# Patient Record
Sex: Female | Born: 1975 | Race: White | Hispanic: No | Smoking: Former smoker
Health system: Southern US, Community
[De-identification: ages and names within clinical notes are randomized; demographics above are authoritative.]

## PROBLEM LIST (undated history)

## (undated) DIAGNOSIS — M797 Fibromyalgia: Secondary | ICD-10-CM

## (undated) DIAGNOSIS — F419 Anxiety disorder, unspecified: Secondary | ICD-10-CM

## (undated) DIAGNOSIS — F32A Depression, unspecified: Secondary | ICD-10-CM

## (undated) DIAGNOSIS — K589 Irritable bowel syndrome without diarrhea: Secondary | ICD-10-CM

## (undated) DIAGNOSIS — M549 Dorsalgia, unspecified: Secondary | ICD-10-CM

## (undated) DIAGNOSIS — K219 Gastro-esophageal reflux disease without esophagitis: Secondary | ICD-10-CM

## (undated) DIAGNOSIS — G43909 Migraine, unspecified, not intractable, without status migrainosus: Secondary | ICD-10-CM

## (undated) DIAGNOSIS — T8859XA Other complications of anesthesia, initial encounter: Secondary | ICD-10-CM

## (undated) DIAGNOSIS — T4145XA Adverse effect of unspecified anesthetic, initial encounter: Secondary | ICD-10-CM

## (undated) DIAGNOSIS — M542 Cervicalgia: Secondary | ICD-10-CM

## (undated) DIAGNOSIS — F329 Major depressive disorder, single episode, unspecified: Secondary | ICD-10-CM

## (undated) HISTORY — DX: Anxiety disorder, unspecified: F41.9

## (undated) HISTORY — PX: BACK SURGERY: SHX140

## (undated) HISTORY — DX: Cervicalgia: M54.2

## (undated) HISTORY — PX: OTHER SURGICAL HISTORY: SHX169

## (undated) HISTORY — PX: TYMPANOSTOMY TUBE PLACEMENT: SHX32

## (undated) HISTORY — PX: TONSILLECTOMY: SUR1361

## (undated) HISTORY — DX: Fibromyalgia: M79.7

## (undated) HISTORY — PX: SPINE SURGERY: SHX786

## (undated) HISTORY — PX: INTRAUTERINE DEVICE INSERTION: SHX323

## (undated) HISTORY — DX: Irritable bowel syndrome, unspecified: K58.9

---

## 2000-06-10 ENCOUNTER — Inpatient Hospital Stay (HOSPITAL_COMMUNITY): Admission: AD | Admit: 2000-06-10 | Discharge: 2000-06-10 | Payer: Self-pay

## 2000-06-15 ENCOUNTER — Inpatient Hospital Stay (HOSPITAL_COMMUNITY): Admission: AD | Admit: 2000-06-15 | Discharge: 2000-06-15 | Payer: Self-pay | Admitting: Obstetrics

## 2000-06-15 ENCOUNTER — Encounter: Payer: Self-pay | Admitting: Obstetrics

## 2000-06-18 ENCOUNTER — Encounter (HOSPITAL_COMMUNITY): Admission: RE | Admit: 2000-06-18 | Discharge: 2000-06-30 | Payer: Self-pay | Admitting: Obstetrics

## 2000-06-27 ENCOUNTER — Inpatient Hospital Stay (HOSPITAL_COMMUNITY): Admission: AD | Admit: 2000-06-27 | Discharge: 2000-06-27 | Payer: Self-pay | Admitting: Obstetrics

## 2000-06-28 ENCOUNTER — Inpatient Hospital Stay (HOSPITAL_COMMUNITY): Admission: AD | Admit: 2000-06-28 | Discharge: 2000-06-28 | Payer: Self-pay | Admitting: Obstetrics

## 2000-06-28 ENCOUNTER — Encounter: Payer: Self-pay | Admitting: Obstetrics

## 2000-06-29 ENCOUNTER — Inpatient Hospital Stay (HOSPITAL_COMMUNITY): Admission: AD | Admit: 2000-06-29 | Discharge: 2000-07-01 | Payer: Self-pay | Admitting: Obstetrics

## 2001-09-20 ENCOUNTER — Other Ambulatory Visit: Admission: RE | Admit: 2001-09-20 | Discharge: 2001-09-20 | Payer: Self-pay | Admitting: Internal Medicine

## 2003-08-12 ENCOUNTER — Emergency Department (HOSPITAL_COMMUNITY): Admission: AD | Admit: 2003-08-12 | Discharge: 2003-08-12 | Payer: Self-pay | Admitting: Family Medicine

## 2007-06-01 ENCOUNTER — Ambulatory Visit (HOSPITAL_COMMUNITY): Admission: RE | Admit: 2007-06-01 | Discharge: 2007-06-01 | Payer: Self-pay | Admitting: *Deleted

## 2007-06-08 ENCOUNTER — Ambulatory Visit (HOSPITAL_COMMUNITY): Admission: RE | Admit: 2007-06-08 | Discharge: 2007-06-08 | Payer: Self-pay | Admitting: *Deleted

## 2007-06-17 ENCOUNTER — Encounter: Admission: RE | Admit: 2007-06-17 | Discharge: 2007-09-07 | Payer: Self-pay | Admitting: *Deleted

## 2007-08-10 ENCOUNTER — Ambulatory Visit (HOSPITAL_COMMUNITY): Admission: RE | Admit: 2007-08-10 | Discharge: 2007-08-10 | Payer: Self-pay | Admitting: *Deleted

## 2007-12-20 ENCOUNTER — Encounter: Admission: RE | Admit: 2007-12-20 | Discharge: 2008-03-19 | Payer: Self-pay | Admitting: *Deleted

## 2007-12-30 HISTORY — PX: LAPAROSCOPIC GASTRIC BANDING: SHX1100

## 2008-01-04 ENCOUNTER — Ambulatory Visit (HOSPITAL_COMMUNITY): Admission: RE | Admit: 2008-01-04 | Discharge: 2008-01-05 | Payer: Self-pay | Admitting: *Deleted

## 2008-04-06 ENCOUNTER — Encounter: Admission: RE | Admit: 2008-04-06 | Discharge: 2008-04-06 | Payer: Self-pay | Admitting: *Deleted

## 2008-07-05 ENCOUNTER — Encounter: Admission: RE | Admit: 2008-07-05 | Discharge: 2008-07-05 | Payer: Self-pay | Admitting: *Deleted

## 2008-11-09 ENCOUNTER — Emergency Department (HOSPITAL_COMMUNITY): Admission: EM | Admit: 2008-11-09 | Discharge: 2008-11-09 | Payer: Self-pay | Admitting: Emergency Medicine

## 2009-03-02 ENCOUNTER — Encounter (INDEPENDENT_AMBULATORY_CARE_PROVIDER_SITE_OTHER): Payer: Self-pay | Admitting: Orthopedic Surgery

## 2009-03-02 ENCOUNTER — Ambulatory Visit (HOSPITAL_COMMUNITY): Admission: AD | Admit: 2009-03-02 | Discharge: 2009-03-04 | Payer: Self-pay | Admitting: Orthopedic Surgery

## 2009-03-23 ENCOUNTER — Ambulatory Visit (HOSPITAL_COMMUNITY): Admission: RE | Admit: 2009-03-23 | Discharge: 2009-03-23 | Payer: Self-pay | Admitting: General Surgery

## 2009-09-06 ENCOUNTER — Ambulatory Visit (HOSPITAL_COMMUNITY): Admission: RE | Admit: 2009-09-06 | Discharge: 2009-09-08 | Payer: Self-pay | Admitting: Orthopedic Surgery

## 2009-09-06 ENCOUNTER — Encounter (INDEPENDENT_AMBULATORY_CARE_PROVIDER_SITE_OTHER): Payer: Self-pay | Admitting: Orthopedic Surgery

## 2010-02-04 ENCOUNTER — Ambulatory Visit: Payer: Self-pay | Admitting: Surgery

## 2010-02-27 ENCOUNTER — Ambulatory Visit: Payer: Self-pay | Admitting: Vascular Surgery

## 2010-02-27 ENCOUNTER — Inpatient Hospital Stay (HOSPITAL_COMMUNITY): Admission: RE | Admit: 2010-02-27 | Discharge: 2010-03-02 | Payer: Self-pay | Admitting: Orthopedic Surgery

## 2010-02-28 ENCOUNTER — Encounter (INDEPENDENT_AMBULATORY_CARE_PROVIDER_SITE_OTHER): Payer: Self-pay | Admitting: Orthopedic Surgery

## 2010-02-28 ENCOUNTER — Ambulatory Visit: Payer: Self-pay | Admitting: Vascular Surgery

## 2010-12-16 LAB — URINALYSIS, ROUTINE W REFLEX MICROSCOPIC
Glucose, UA: NEGATIVE mg/dL
Hgb urine dipstick: NEGATIVE
Ketones, ur: NEGATIVE mg/dL
Protein, ur: NEGATIVE mg/dL
Specific Gravity, Urine: 1.011 (ref 1.005–1.030)

## 2010-12-16 LAB — PREGNANCY, URINE: Preg Test, Ur: NEGATIVE

## 2010-12-16 LAB — CBC
HCT: 40.8 % (ref 36.0–46.0)
MCV: 91.4 fL (ref 78.0–100.0)
Platelets: 275 10*3/uL (ref 150–400)
RBC: 4.47 MIL/uL (ref 3.87–5.11)
WBC: 5.8 10*3/uL (ref 4.0–10.5)

## 2010-12-16 LAB — URINE MICROSCOPIC-ADD ON

## 2011-01-06 LAB — HEMOGLOBIN AND HEMATOCRIT, BLOOD: Hemoglobin: 14 g/dL (ref 12.0–15.0)

## 2011-01-06 LAB — PREGNANCY, URINE: Preg Test, Ur: NEGATIVE

## 2011-02-11 NOTE — Op Note (Signed)
NAME:  CARRY, WEESNER             ACCOUNT NO.:  [id number]   MEDICAL RECORD NO.:  [id number]          PATIENT TYPE:  AMB   LOCATION:  DAY                          FACILITY:  [facility]   PHYSICIAN:  Thornton Park. Daphine Deutscher, MD  DATE OF BIRTH:  11/24/1975   DATE OF PROCEDURE:  01/04/2008  DATE OF DISCHARGE:                               OPERATIVE REPORT   PREOPERATIVE DIAGNOSIS:  Morbid obesity and small hiatal hernia.  BMI of  40.   POSTOPERATIVE DIAGNOSES:  Morbid obesity and small hiatal hernia.  BMI  of 40.   PROCEDURE:  Lap band APS system by Allergan with two suture closure of  the hiatus.   SURGEON:  Thornton Park. Daphine Deutscher, MD   ASSISTANT:  Sandria Bales. Ezzard Standing, M.D.   ANESTHESIA:  General endotracheal.   DESCRIPTION OF PROCEDURE:  Naika Noto was taken to room one, given  general anesthesia.  The abdomen was prepped with Techni-Care and draped  sterilely.  I entered the abdomen through the left upper quadrant using  a 0 degree OptiVu 10 mm without difficulty.  Once insufflated two ports  were placed on the right including a 15 in the upper position and a  regular 12 on the right and then another one to the left of the  umbilicus for the camera and a 5 in the upper midline for the liver  retractor using the Bagley.  First dissection was done along the  gastrohepatic window and looked at the hiatus.  I also passed an APS  balloon, pulled it back and it did not go above the EG junction.  However, in looking at the crura there was a gap that was very prominent  and I could see how this sliding hernia worked.  I went ahead and  repaired it with two free sutures using the tie knots. This approximated  the crura posteriorly nicely.   I then slid down to the pars flaccida approach and passed the band  passer and then brought the APS band around the stomach and snapped it  in place.  It was then plicated anteriorly with three sutures again  using the tie knot and Surgitek material.  It  was brought out through  the lower port on the right and a pocket was created for the port.  Port  was connected to the tubing and it was placed in the abdomen and then  tied down to the fascia.  The wounds were irrigated, closed with 4-0  Vicryl and with Dermabond.  The patient seemed to tolerate the procedure  well and was taken to recovery room in satisfactory condition.      Thornton Park Daphine Deutscher, MD  Electronically Signed     MBM/MEDQ  D:  01/04/2008  T:  01/04/2008  Job:  [id number]   cc:   Dr. Brandt Loosen

## 2011-02-11 NOTE — Op Note (Signed)
NAME:  Kimberly Simpson, Kimberly Simpson             ACCOUNT NO.:  [id number]   MEDICAL RECORD NO.:  [id number]          PATIENT TYPE:  OIB   LOCATION:  1509                         FACILITY:  [facility]   PHYSICIAN:  Marlowe Kays, M.D.  DATE OF BIRTH:  Jan 25, 1976   DATE OF PROCEDURE:  03/02/2009  DATE OF DISCHARGE:                               OPERATIVE REPORT   PREOPERATIVE DIAGNOSIS:  Herniated nucleus pulposus, L5-S1, left.   POSTOPERATIVE DIAGNOSIS:  Herniated nucleus pulposus, L5-S1, left.   OPERATION:  Microdiskectomy, L5-S1 left, with excision of multiple free  fragments.   SURGEON:  Marlowe Kays, MD.   ASSISTANT:  Georges Lynch. Gioffre, MD.   ANESTHESIA:  General.   INDICATION FOR PROCEDURE:  Severe left leg pain with numbness in her  foot, and a large disk herniation central and to the left at L5-S1.   PROCEDURE:  Prophylactic antibiotics, satisfactory general anesthesia,  knee-chest position on the Andrews frame, back was prepped with  DuraPrep, and with 3 spinal needles and a lateral x-ray we tentatively  localized the L5-S1 interspace and then continued draping the back in a  sterile field.  Based on the initial x-ray, I made a vertical midline  incision and located what we thought was the spinous process of L5,  which we tacked the Kocher clamp and a bony prominence distal to this.  A second lateral x-ray demonstrated that the superior Kocher was on the  spinous processes of L5, and we were able to identify the sacrum clearly  by anatomy on further dissection.  Soft tissue was dissected off of the  lamina to either side, and a self-retaining McCullough retractor placed.  I  undermined the superior portion of the segment with a small curette.  First with 2 mm and then with 3 mm Kerrison rongeurs, I began removing  bone and ligamentum flavum.  We then brought in the microscope and  completed the removal of ligamentum flavum and lateral bone.  The S1  nerve root was identified.  It  was quite swollen and inflamed.  We are  able to mobilize it and beneath it was a large protruding amount of disk  fragment, which actually came forth under pressure and was a large  expelled disk fragment about the size of my thumb nail.  I then removed  numerous other large pieces of disk material and then entered the  interspace removing additional disk material until all disk material  obtainable was removed.  The L5 nerve root was noted and protected  during the course of this.  At the conclusion of removing all the disk  material we could obtain, I used a hockey-stick and found that the L5  nerve root foramen and that for the S1 were both widely patent and the  dura and S1 nerve root were freely movable.  There was no unusual  bleeding during the case.  The wound was irrigated with sterile saline.  Gelfoam soaked in thrombin was placed over the interspace and around the  S1 nerve root and dura.  The self-retaining retractors were carefully  removed and the wound closed in  layers with interrupted #1 Vicryl in the  fascia and deep subcutaneous tissue, 2-0 Vicryl  superficial in the subcutaneous tissue, and staples in the skin.  Betadine and Adaptic dry sterile dressing were applied.  She tolerated  the procedure well, of course was cautiously placed in a PACU bed, and  taken there without any known complication.           ______________________________  Marlowe Kays, M.D.     JA/MEDQ  D:  03/02/2009  T:  03/03/2009  Job:  [id number]

## 2011-02-11 NOTE — Assessment & Plan Note (Signed)
OFFICE VISIT   Kimberly Simpson, Kimberly Simpson  DOB:  09/30/1975                                       02/04/2010  ZOXWR#:[id number]   REASON FOR VISIT:  Back pain.   HISTORY:  This is a 35 year old female seen at rest of Dr. Shon Baton to  evaluate for the anterior lumbar exposure.  The patient has had prior  posterior approaches to her back pain without benefit, and she is now  scheduled for anterior lumbar surgery.  Patient has a history of having  a lap band placed.  She still has report, which is readily palpable in  the right side of her abdomen.  She does not have any history of DVT.  She does not have symptoms of arterial insufficiency in her lower  extremities.   The patient suffers from headaches for which she takes Topamax for.  She  has approximately 2 to 3 migraines a month.  She also takes medication  for depression.   PAST MEDICAL HISTORY:  Past medical history is positive for migraines,  depression and back pain, obesity, status post lap band repair.   PAST SURGICAL HISTORY:  Lap band surgery, tonsillectomy, left knee and  back surgery.   FAMILY HISTORY:  Negative for cardiovascular at an early age.   SOCIAL HISTORY:  She is widowed with 2 children.  She works in  Administrator, [employer].  Quit smoking in 1998.  Does not drink.   REVIEW OF SYSTEMS:  Positive for weight loss.  CARDIAC:  Negative.  PULMONARY:  Negative.  GI:  Positive constipation.  GU:  Negative.  VASCULAR:  Positive pain in legs when walking and lying flat.  NEURO:  Positive for headaches.  MUSCULOSKELETAL:  Negative.  PSYCH:  Positive for depression.  EENT:  Negative.  HEME:  Negative.  SKIN:  Negative.   ALLERGIES:  Allergies are to Cipro.   MEDICATIONS:  Medications include hydrocodone, Cymbalta, Topamax,  ibuprofen, and multivitamin.   PHYSICAL EXAMINATION:  Heart rate 84, blood pressure 108/71, respiratory  rate 20.  GENERAL:  She is well-appearing, in no distress.  HEENT:   Within normal limits.  LUNGS:  Clear bilaterally.  No wheezing or rhonchi.  CARDIOVASCULAR:  Regular rate and rhythm.  No murmur.  She has palpable  pedal pulses.  ABDOMEN:  Obese but soft.  She has healed port sites.  Her lap band port  is felt palpable the right midabdomen.  No hernias are appreciated.  SKIN:  Without rash.  NEURO:  She is no focal deficits.   ASSESSMENT/PLAN:  Back pain, evaluation for anterior lumbar approach.   PLAN:  I discussed the risks of proceeding include risk of artery and  vein injury and their ramifications.  I think the biggest concern is  making sure we do not disrupt her lap band.  Based on its placement, I  do not think it is going to be an issue, but certainly we discussed that  it could potentially become exposed.  I think that based on where the  incision will be for her exposure, there is a low risk.  We will  coordinate with Dr. Shon Baton to get this on the on the schedule.     Jorge Ny, MD  Electronically Signed   VWB/MEDQ  D:  02/04/2010  T:  02/05/2010  Job:  [id number]

## 2011-03-03 ENCOUNTER — Encounter (INDEPENDENT_AMBULATORY_CARE_PROVIDER_SITE_OTHER): Payer: Self-pay | Admitting: Surgery

## 2011-05-09 ENCOUNTER — Ambulatory Visit (INDEPENDENT_AMBULATORY_CARE_PROVIDER_SITE_OTHER): Payer: BC Managed Care – PPO | Admitting: Physician Assistant

## 2011-05-09 ENCOUNTER — Encounter (INDEPENDENT_AMBULATORY_CARE_PROVIDER_SITE_OTHER): Payer: Self-pay

## 2011-05-09 VITALS — BP 106/78

## 2011-05-09 DIAGNOSIS — Z4651 Encounter for fitting and adjustment of gastric lap band: Secondary | ICD-10-CM

## 2011-05-09 NOTE — Progress Notes (Signed)
  HISTORY: Kimberly Simpson is a 35 y.o.female who received an AP-Standard lap-band in April 2009 by Dr. Daphine Deutscher. She comes in having been seen 3 months ago and has gained 5 lbs however her clothes have not changed in terms of fit as she has been going to the gym for 2 hours a day, 5 days a week. She denies vomiting or regurgitation on a consistent basis but says her hunger has increased as have her portion sizes. She would like an adjustment today.  VITAL SIGNS: Filed Vitals:   05/09/11 0904  BP: 106/78    PHYSICAL EXAM: Physical exam reveals a very well-appearing 35 y.o.female in no apparent distress Neurologic: Awake, alert, oriented Psych: Bright affect, conversant Respiratory: Breathing even and unlabored. No stridor or wheezing Abdomen: Soft, nontender, nondistended to palpation. Incisions well-healed. No incisional hernias. Port easily palpated. Extremities: Atraumatic, good range of motion.  ASSESMENT: 35 y.o.  female  s/p AP-Standard lap-band.   PLAN: The patient's port was accessed with a 20G Huber needle without difficulty. Clear fluid was aspirated and 0.25 mL saline was added to the port to give a total volume of 5.75 mL. The patient was able to swallow water without difficulty following the procedure and was instructed to take clear liquids for the next 24-48 hours and advance slowly as tolerated.

## 2011-05-09 NOTE — Patient Instructions (Signed)
Take clear liquids for the next 48 hours. Thin protein shakes are ok to start on Saturday evening. Call us if you have persistent vomiting or regurgitation, night cough or reflux symptoms. Return as scheduled or sooner if you notice no changes in hunger/portion sizes.   

## 2011-06-23 LAB — HEMOGLOBIN AND HEMATOCRIT, BLOOD: Hemoglobin: 15.3 — ABNORMAL HIGH

## 2011-06-24 LAB — CBC
HCT: 38.1
MCHC: 35.6
MCV: 88
Platelets: 249

## 2011-06-24 LAB — DIFFERENTIAL
Basophils Relative: 0
Eosinophils Absolute: 0.1
Eosinophils Relative: 1
Neutrophils Relative %: 55

## 2011-07-28 ENCOUNTER — Encounter (INDEPENDENT_AMBULATORY_CARE_PROVIDER_SITE_OTHER): Payer: Self-pay

## 2011-09-26 ENCOUNTER — Ambulatory Visit (INDEPENDENT_AMBULATORY_CARE_PROVIDER_SITE_OTHER): Payer: BC Managed Care – PPO | Admitting: Physician Assistant

## 2011-09-26 ENCOUNTER — Encounter (INDEPENDENT_AMBULATORY_CARE_PROVIDER_SITE_OTHER): Payer: Self-pay

## 2011-09-26 VITALS — BP 102/70 | HR 79 | Temp 98.2°F | Ht 64.0 in | Wt 191.4 lb

## 2011-09-26 DIAGNOSIS — E669 Obesity, unspecified: Secondary | ICD-10-CM

## 2011-09-26 DIAGNOSIS — Z4651 Encounter for fitting and adjustment of gastric lap band: Secondary | ICD-10-CM

## 2011-09-26 NOTE — Patient Instructions (Signed)
Take clear liquids for the next 48 hours. Thin protein shakes are ok to start on Saturday evening. Call us if you have persistent vomiting or regurgitation, night cough or reflux symptoms. Return as scheduled or sooner if you notice no changes in hunger/portion sizes.   

## 2011-09-26 NOTE — Progress Notes (Signed)
  HISTORY: Kimberly Simpson is a 35 y.o.female who received an AP-Standard lap-band in April 2009 by Dr. Daphine Deutscher. She comes in with complaints of increased hunger and portion sizes. No vomiting or regurgitation. She'd like an adjustment as her weight is continuing to increase.  VITAL SIGNS: Filed Vitals:   09/26/11 1029  BP: 102/70  Pulse: 79  Temp: 98.2 F (36.8 C)    PHYSICAL EXAM: Physical exam reveals a very well-appearing 35 y.o.female in no apparent distress Neurologic: Awake, alert, oriented Psych: Bright affect, conversant Respiratory: Breathing even and unlabored. No stridor or wheezing Abdomen: Soft, nontender, nondistended to palpation. Incisions well-healed. No incisional hernias. Port easily palpated. Extremities: Atraumatic, good range of motion.  ASSESMENT: 35 y.o.  female  s/p AP-Standard lap-band.   PLAN: The patient's port was accessed with a 20G Huber needle without difficulty. Clear fluid was aspirated and 0.25 mL saline was added to the port to give a total predicted volume of 6 mL. The patient was able to swallow water without difficulty following the procedure and was instructed to take clear liquids for the next 24-48 hours and advance slowly as tolerated.

## 2012-02-02 ENCOUNTER — Encounter (INDEPENDENT_AMBULATORY_CARE_PROVIDER_SITE_OTHER): Payer: Self-pay | Admitting: General Surgery

## 2012-06-01 ENCOUNTER — Other Ambulatory Visit: Payer: Self-pay | Admitting: Family Medicine

## 2012-06-01 ENCOUNTER — Other Ambulatory Visit (HOSPITAL_COMMUNITY)
Admission: RE | Admit: 2012-06-01 | Discharge: 2012-06-01 | Disposition: A | Discharge status: Home / Self Care | Payer: BC Managed Care – PPO | Source: Ambulatory Visit | Attending: Family Medicine | Admitting: Family Medicine

## 2012-06-01 DIAGNOSIS — Z124 Encounter for screening for malignant neoplasm of cervix: Secondary | ICD-10-CM | POA: Insufficient documentation

## 2012-06-01 DIAGNOSIS — Z113 Encounter for screening for infections with a predominantly sexual mode of transmission: Secondary | ICD-10-CM | POA: Insufficient documentation

## 2012-06-01 DIAGNOSIS — R87810 Cervical high risk human papillomavirus (HPV) DNA test positive: Secondary | ICD-10-CM | POA: Insufficient documentation

## 2012-07-02 ENCOUNTER — Other Ambulatory Visit: Payer: Self-pay | Admitting: Family Medicine

## 2012-11-01 ENCOUNTER — Telehealth (INDEPENDENT_AMBULATORY_CARE_PROVIDER_SITE_OTHER): Payer: Self-pay

## 2012-11-01 NOTE — Telephone Encounter (Signed)
Pt aware of appt 11/02/12 @ 4p

## 2012-11-02 ENCOUNTER — Encounter (INDEPENDENT_AMBULATORY_CARE_PROVIDER_SITE_OTHER): Payer: Self-pay | Admitting: Surgery

## 2012-11-02 ENCOUNTER — Ambulatory Visit (INDEPENDENT_AMBULATORY_CARE_PROVIDER_SITE_OTHER): Payer: BC Managed Care – PPO | Admitting: Surgery

## 2012-11-02 VITALS — BP 112/66 | HR 76 | Temp 97.8°F | Resp 16 | Ht 65.0 in | Wt 178.6 lb

## 2012-11-02 DIAGNOSIS — Z9884 Bariatric surgery status: Secondary | ICD-10-CM | POA: Insufficient documentation

## 2012-11-02 NOTE — Progress Notes (Signed)
[facility]  Ovidio Kin, MD,  FACS [address] [place]  [address] [place], [place]    [address] Phone:  [phone] FAX:  [phone]   Re:   SHELBA SUSI DOB:   09-15-1976 MRN:   [id number]  ASSESSMENT AND PLAN: 1.  History of Lap Band, APS, and HH repair - M. Daphine Deutscher - 01/04/2008  Lap band too tight, reason not clear.  2.0 cc fluid removed today.  To see Dr. Daphine Deutscher or Mardelle Matte back in 8 weeks.  If no better in next few days, will call office back and will be seen earlier.  Will obtain KUB for lap band placement.  2.  History of back surgery - June 2011.  Sheela Stack   HISTORY OF PRESENT ILLNESS: Chief Complaint  Patient presents with  . Other    vomiting and reflus / ?lap band slip / pt of MM    MARVETTE SCHAMP is a 37 y.o. (DOB: October 20, 1975)  white female who is a patient of RANKINS,VICTORIA, MD and comes to me today for evaluation of lap band.  Ms. Buschman was last seen by Mardelle Matte 09/26/2011 - so it has been > one year.  Last x-ray that included lap band was 02/28/2010 during her back surgery - the band location looked okay at that time.  The patient has been happy with her lap band and her weight loss. Over Christmas 2013, she developed bronchitis and received 2 courses of antibiotics and steroids. She noticed an increase in reflux during this time. Then over the [employer] week end, she had increasing trouble with regurgitation. She developed intolerance to solid food.  She thought that half the food she ate she spit back up. After spitting up, she is still hungry, but cannot tolerate more than liquids right now.  She's had good weight loss. She is 13 pounds lighter than when she was last seen in December of 2012.  After removing the 2 cc of fluid from her lap band, she felt better, but still has some symptoms. She knows to call our office if she is not symptom-free in 4-7 days.  PHYSICAL EXAM: BP 112/66  Pulse 76  Temp 97.8 F (36.6 C)  (Temporal)  Resp 16  Ht 5\' 5"  (1.651 m)  Wt 178 lb 9.6 oz (81.012 kg)  BMI 29.72 kg/m2  Abdomen - soft, BS present.  Port in good location.  Procedure:  The lap band was accessed.  It was under about 0.5 cc of pressure.  I removed 2.0 cc, leaving about 4.0 cc in port.  DATA REVIEWED: Old chart.  Ovidio Kin, MD, FACS Office:  [phone]

## 2012-11-03 ENCOUNTER — Telehealth (INDEPENDENT_AMBULATORY_CARE_PROVIDER_SITE_OTHER): Payer: Self-pay

## 2012-11-03 ENCOUNTER — Encounter (INDEPENDENT_AMBULATORY_CARE_PROVIDER_SITE_OTHER): Payer: BC Managed Care – PPO | Admitting: Surgery

## 2012-11-03 ENCOUNTER — Ambulatory Visit (HOSPITAL_COMMUNITY)
Admission: RE | Admit: 2012-11-03 | Discharge: 2012-11-03 | Disposition: A | Discharge status: Home / Self Care | Payer: BC Managed Care – PPO | Source: Ambulatory Visit | Attending: Surgery | Admitting: Surgery

## 2012-11-03 ENCOUNTER — Other Ambulatory Visit (INDEPENDENT_AMBULATORY_CARE_PROVIDER_SITE_OTHER): Payer: Self-pay

## 2012-11-03 DIAGNOSIS — R112 Nausea with vomiting, unspecified: Secondary | ICD-10-CM | POA: Insufficient documentation

## 2012-11-03 DIAGNOSIS — Z9884 Bariatric surgery status: Secondary | ICD-10-CM | POA: Insufficient documentation

## 2012-11-03 NOTE — Telephone Encounter (Signed)
Called pt to let her know that I scheduled her appt with MM for Thursday, Feb 13th @ 310p.  Pt stated that she was hoping for an appt after 4p, so I r/s for 415p.

## 2012-11-03 NOTE — Telephone Encounter (Addendum)
CORRECTION; appt time is set for 430p - not 415p

## 2012-11-04 ENCOUNTER — Telehealth (INDEPENDENT_AMBULATORY_CARE_PROVIDER_SITE_OTHER): Payer: Self-pay

## 2012-11-04 NOTE — Telephone Encounter (Signed)
Follow up call . Patient states her stomach is still hurting but she will be ok until she see Dr. Daphine Deutscher 11/11/12 @ 4:30. I informed her that her Lap band is in place per Dr. Ezzard Standing. Advised her  To call if  Her  condition changed.

## 2012-11-11 ENCOUNTER — Encounter (INDEPENDENT_AMBULATORY_CARE_PROVIDER_SITE_OTHER): Payer: BC Managed Care – PPO | Admitting: Surgery

## 2012-11-18 ENCOUNTER — Encounter (INDEPENDENT_AMBULATORY_CARE_PROVIDER_SITE_OTHER): Payer: BC Managed Care – PPO | Admitting: Surgery

## 2012-11-22 ENCOUNTER — Telehealth (INDEPENDENT_AMBULATORY_CARE_PROVIDER_SITE_OTHER): Payer: Self-pay

## 2012-11-22 NOTE — Telephone Encounter (Signed)
Spoke with pt, giving her her new appt date and time: Monday, March 3 @ 450p.

## 2012-11-26 ENCOUNTER — Telehealth (INDEPENDENT_AMBULATORY_CARE_PROVIDER_SITE_OTHER): Payer: Self-pay

## 2012-11-26 NOTE — Telephone Encounter (Signed)
Called pt to see if she can r/s her appt time on 3/3 from 450 to 12noon.  Pt stated that she can only come early mornings or late afternoons.  We r/s's for 3/3 @ 830a.

## 2012-11-29 ENCOUNTER — Ambulatory Visit (INDEPENDENT_AMBULATORY_CARE_PROVIDER_SITE_OTHER): Payer: BC Managed Care – PPO | Admitting: Surgery

## 2012-11-29 ENCOUNTER — Encounter (INDEPENDENT_AMBULATORY_CARE_PROVIDER_SITE_OTHER): Payer: BC Managed Care – PPO | Admitting: Surgery

## 2012-11-29 VITALS — BP 104/62 | HR 76 | Temp 97.2°F | Resp 16 | Ht 64.0 in | Wt 187.8 lb

## 2012-11-29 DIAGNOSIS — Z9884 Bariatric surgery status: Secondary | ICD-10-CM

## 2012-11-29 NOTE — Patient Instructions (Signed)

## 2012-11-29 NOTE — Progress Notes (Signed)
Lapband Fill Encounter Problem List:   Patient Active Problem List  Diagnosis  . History of laparoscopic adjustable gastric banding, APS, 01/04/2008    [name] Body mass index is 32.22 kg/(m^2). Weight loss since surgery  46.2    Having regurgitation?:  Not anymore  Feel that they need a fill?  yes  Nocturnal reflux?  no  Amount of fill  1 cc   Port site: Easily accessed  Instructions given and weight loss goals discussed.  Xray showed lapband position to be good.  Will followup with Mardelle Matte or me in 3 months  Matt B. Daphine Deutscher, MD, FACS

## 2012-12-16 ENCOUNTER — Encounter (INDEPENDENT_AMBULATORY_CARE_PROVIDER_SITE_OTHER): Payer: BC Managed Care – PPO

## 2012-12-23 ENCOUNTER — Ambulatory Visit (INDEPENDENT_AMBULATORY_CARE_PROVIDER_SITE_OTHER): Payer: BC Managed Care – PPO | Admitting: Physician Assistant

## 2012-12-23 ENCOUNTER — Encounter (INDEPENDENT_AMBULATORY_CARE_PROVIDER_SITE_OTHER): Payer: Self-pay

## 2012-12-23 VITALS — BP 102/68 | HR 68 | Temp 98.2°F | Resp 16 | Ht 64.0 in | Wt 189.6 lb

## 2012-12-23 DIAGNOSIS — Z4651 Encounter for fitting and adjustment of gastric lap band: Secondary | ICD-10-CM

## 2012-12-23 NOTE — Progress Notes (Signed)
  HISTORY: Kimberly Simpson is a 37 y.o.female who received an AP-Standard lap-band in April 2009 by Dr. Daphine Deutscher. She comes in with continued hunger and large portion sizes. She had fluid removed in February by Dr. Ezzard Standing for obstructive symptoms. She had 1 mL replaced by Dr. Daphine Deutscher earlier this month. Her total volume was 6 mL for a year before having any issues.  VITAL SIGNS: Filed Vitals:   12/23/12 1118  BP: 102/68  Pulse: 68  Temp: 98.2 F (36.8 C)  Resp: 16    PHYSICAL EXAM: Physical exam reveals a very well-appearing 37 y.o.female in no apparent distress Neurologic: Awake, alert, oriented Psych: Bright affect, conversant Respiratory: Breathing even and unlabored. No stridor or wheezing Abdomen: Soft, nontender, nondistended to palpation. Incisions well-healed. No incisional hernias. Port easily palpated. Extremities: Atraumatic, good range of motion.  ASSESMENT: 37 y.o.  female  s/p AP-Standard lap-band.   PLAN: The patient's port was accessed with a 20G Huber needle without difficulty. Clear fluid was aspirated and 1 mL saline was added to the port to give a total predicted volume of 6 mL. The patient was able to swallow water without difficulty following the procedure and was instructed to take clear liquids for the next 24-48 hours and advance slowly as tolerated.

## 2012-12-23 NOTE — Patient Instructions (Signed)
Take clear liquids tonight. Thin protein shakes are ok to start tomorrow morning. Slowly advance your diet thereafter. Call us if you have persistent vomiting or regurgitation, night cough or reflux symptoms. Return as scheduled or sooner if you notice no changes in hunger/portion sizes.  

## 2013-01-03 ENCOUNTER — Other Ambulatory Visit: Payer: Self-pay | Admitting: Family Medicine

## 2013-01-03 ENCOUNTER — Other Ambulatory Visit (HOSPITAL_COMMUNITY)
Admission: RE | Admit: 2013-01-03 | Discharge: 2013-01-03 | Disposition: A | Discharge status: Home / Self Care | Payer: BC Managed Care – PPO | Source: Ambulatory Visit | Attending: Family Medicine | Admitting: Family Medicine

## 2013-01-03 DIAGNOSIS — R6889 Other general symptoms and signs: Secondary | ICD-10-CM | POA: Insufficient documentation

## 2013-01-03 DIAGNOSIS — Z1151 Encounter for screening for human papillomavirus (HPV): Secondary | ICD-10-CM | POA: Insufficient documentation

## 2013-01-11 ENCOUNTER — Telehealth (INDEPENDENT_AMBULATORY_CARE_PROVIDER_SITE_OTHER): Payer: Self-pay | Admitting: *Deleted

## 2013-01-11 NOTE — Telephone Encounter (Signed)
Patient states she is only able to take in liquids.  Patient feels that her band is too tight.

## 2013-01-12 ENCOUNTER — Ambulatory Visit (INDEPENDENT_AMBULATORY_CARE_PROVIDER_SITE_OTHER): Payer: BC Managed Care – PPO | Admitting: General Surgery

## 2013-01-12 ENCOUNTER — Encounter (INDEPENDENT_AMBULATORY_CARE_PROVIDER_SITE_OTHER): Payer: Self-pay | Admitting: General Surgery

## 2013-01-12 VITALS — BP 118/72 | HR 70 | Resp 18 | Ht 64.0 in | Wt 183.0 lb

## 2013-01-12 DIAGNOSIS — Z4651 Encounter for fitting and adjustment of gastric lap band: Secondary | ICD-10-CM

## 2013-01-12 NOTE — Progress Notes (Signed)
History: Patient has had her lap band since 2009. She has had overall very good weight loss. She became over restrictive in February and had 2 cc removed. She had 1 cc replaced in March and another cc replaced 3 weeks ago. Since her last fill she has had essentially intolerance to all solid foods and some difficulty with liquids and also nighttime regurgitation over the last 4 or 5 days.  Exam: BP 118/72  Pulse 70  Resp 18  Ht 5\' 4"  (1.626 m)  Wt 183 lb (83.008 kg)  BMI 31.4 kg/m2 Total weight loss 51 pounds, 7 pounds since her last visit General: Appears well Abdomen: Soft and nontender. Port site looks fine.  Assessment and plan: Over restricted at 6 cc. She is tolerating fluids OK and we elected to remove one half cc. She felt like water went down better after this. She will call if this does not allow her to be small amounts of solid food and otherwise keep her appointment with Dr. Daphine Deutscher in June.

## 2013-01-12 NOTE — Patient Instructions (Addendum)
Liquids only for the next 2 days. Keep your appointment with Dr. Daphine Deutscher or call us if this does not help

## 2013-03-16 ENCOUNTER — Encounter (INDEPENDENT_AMBULATORY_CARE_PROVIDER_SITE_OTHER): Payer: Self-pay | Admitting: Surgery

## 2013-03-16 ENCOUNTER — Ambulatory Visit (INDEPENDENT_AMBULATORY_CARE_PROVIDER_SITE_OTHER): Payer: BC Managed Care – PPO | Admitting: Surgery

## 2013-03-16 VITALS — BP 108/86 | HR 74 | Temp 96.8°F | Resp 14 | Ht 64.0 in | Wt 184.0 lb

## 2013-03-16 DIAGNOSIS — Z9884 Bariatric surgery status: Secondary | ICD-10-CM

## 2013-03-16 NOTE — Progress Notes (Signed)
Lapband Fill Encounter Problem List:   Patient Active Problem List   Diagnosis Date Noted  . History of laparoscopic adjustable gastric banding, APS, 01/04/2008 11/02/2012    Kimberly Simpson Body mass index is 31.57 kg/(m^2). Weight loss since surgery  50  Having regurgitation?:  yes  Feel that they need a fill?  Removal of fluid  Nocturnal reflux? yes  Amount of fill  -0.5     Instructions given and weight loss goals discussed.    Will see sooner if persistant GER.    Matt B. Daphine Deutscher, MD, FACS

## 2013-03-16 NOTE — Patient Instructions (Signed)
If you are still experiencing food tolerance issues and reflux, see Dr. Daphine Deutscher sooner than the scheduled appointment.

## 2013-05-17 ENCOUNTER — Ambulatory Visit (INDEPENDENT_AMBULATORY_CARE_PROVIDER_SITE_OTHER): Payer: BC Managed Care – PPO | Admitting: Surgery

## 2013-05-17 ENCOUNTER — Encounter (INDEPENDENT_AMBULATORY_CARE_PROVIDER_SITE_OTHER): Payer: Self-pay | Admitting: Surgery

## 2013-05-17 VITALS — BP 110/72 | HR 80 | Resp 16 | Ht 64.0 in | Wt 187.0 lb

## 2013-05-17 DIAGNOSIS — Z9884 Bariatric surgery status: Secondary | ICD-10-CM

## 2013-05-17 DIAGNOSIS — Z4651 Encounter for fitting and adjustment of gastric lap band: Secondary | ICD-10-CM

## 2013-05-17 NOTE — Progress Notes (Signed)
Lapband Fill Encounter Problem List:   Patient Active Problem List   Diagnosis Date Noted  . History of laparoscopic adjustable gastric banding, APS, 01/04/2008 11/02/2012    [name] Body mass index is 32.08 kg/(m^2). Weight loss since surgery  47  Having regurgitation?:  no  Feel that they need a fill?  yes  Nocturnal reflux?  no  Amount of fill  0.25     Instructions given and weight loss goals discussed.    Doing well except able eat more and has gained a little weight since I removed 0.5 cc from her band.  0.25 added back  Matt B. Daphine Deutscher, MD, FACS

## 2013-05-17 NOTE — Patient Instructions (Signed)

## 2013-07-13 ENCOUNTER — Encounter (INDEPENDENT_AMBULATORY_CARE_PROVIDER_SITE_OTHER): Payer: Self-pay

## 2013-07-13 ENCOUNTER — Ambulatory Visit (INDEPENDENT_AMBULATORY_CARE_PROVIDER_SITE_OTHER): Payer: BC Managed Care – PPO | Admitting: Surgery

## 2013-07-13 ENCOUNTER — Other Ambulatory Visit (INDEPENDENT_AMBULATORY_CARE_PROVIDER_SITE_OTHER): Payer: Self-pay

## 2013-07-13 ENCOUNTER — Encounter (INDEPENDENT_AMBULATORY_CARE_PROVIDER_SITE_OTHER): Payer: Self-pay | Admitting: Surgery

## 2013-07-13 VITALS — BP 132/80 | HR 79 | Temp 98.0°F | Resp 18 | Ht 64.0 in | Wt 181.0 lb

## 2013-07-13 DIAGNOSIS — R1011 Right upper quadrant pain: Secondary | ICD-10-CM

## 2013-07-13 NOTE — Patient Instructions (Signed)
Cholelithiasis Cholelithiasis (also called gallstones) is a form of gallbladder disease where gallstones form in your gallbladder. The gallbladder is a non-essential organ that stores bile made in the liver, which helps digest fats. Gallstones begin as small crystals and slowly grow into stones. Gallstone pain occurs when the gallbladder spasms, and a gallstone is blocking the duct. Pain can also occur when a stone passes out of the duct.  Women are more likely to develop gallstones than men. Other factors that increase the risk of gallbladder disease are:  Having multiple pregnancies. Physicians sometimes advise removing diseased gallbladders before future pregnancies.  Obesity.  Diets heavy in fried foods and fat.  Increasing age (older than 60).  Prolonged use of medications containing female hormones.  Diabetes mellitus.  Rapid weight loss.  Family history of gallstones (heredity). SYMPTOMS  Feeling sick to your stomach (nauseous).  Abdominal pain.  Yellowing of the skin (jaundice).  Sudden pain. It may persist from several minutes to several hours.  Worsening pain with deep breathing or when jarred.  Fever.  Tenderness to the touch. In some cases, when gallstones do not move into the bile duct, people have no pain or symptoms. These are called "silent" gallstones. TREATMENT In severe cases, emergency surgery may be required. HOME CARE INSTRUCTIONS   Only take over-the-counter or prescription medicines for pain, discomfort, or fever as directed by your caregiver.  Follow a low-fat diet until seen again. Fat causes the gallbladder to contract, which can result in pain.  Follow up as instructed. Attacks are almost always recurrent and surgery is usually required for permanent treatment. SEEK IMMEDIATE MEDICAL CARE IF:   Your pain increases and is not controlled by medications.  You have an oral temperature above 102 F (38.9 C), not controlled by medication.  You  develop nausea and vomiting. MAKE SURE YOU:   Understand these instructions.  Will watch your condition.  Will get help right away if you are not doing well or get worse. Document Released: 09/11/2005 Document Revised: 12/08/2011 Document Reviewed: 11/14/2010 Exit[employer] Patient Information 2014 [employer].  

## 2013-07-13 NOTE — Progress Notes (Signed)
Kimberly Simpson 37 y.o.  Body mass index is 31.05 kg/(m^2).  Patient Active Problem List   Diagnosis Date Noted  . Lapband APS + [facility] repair April 2009 11/02/2012    Allergies  Allergen Reactions  . Ciprocin-Fluocin-Procin [Fluocinolone Acetonide]     Past Surgical History  Procedure Laterality Date  . Spine surgery    . Tonsillectomy    . Back surgery      disc rupture and fusion  . Intrauterine device insertion    . Laparoscopic gastric banding  12/30/2007   Beverley Fiedler, MD 1. Abdominal pain, right upper quadrant     Has been having right upper quadrant pain radiating into her back. Her weight is down to 181 but she says she is unable to keep. She is avoiding food because food brings on pain that is in her upper abdomen and also the stabbing in her back. Her BMI is 31.  Lower ultrasound the gallbladder and I'll see her back shortly thereafter. If she has gallstones which she is concerned about we would proceed with laparoscopic cholecystectomy. Matt B. Daphine Deutscher, MD, [facility] [phone] beeper [phone]  07/13/2013 5:29 PM

## 2013-07-14 ENCOUNTER — Telehealth (INDEPENDENT_AMBULATORY_CARE_PROVIDER_SITE_OTHER): Payer: Self-pay | Admitting: *Deleted

## 2013-07-14 NOTE — Telephone Encounter (Signed)
LMOM for pt to return my call.  I was calling to inform her of an appt at [facility] for an [place] on 07/19/13 with an arrival time of 7:45 am.  Pt should be NPO after midnight.

## 2013-07-15 ENCOUNTER — Ambulatory Visit
Admission: RE | Admit: 2013-07-15 | Discharge: 2013-07-15 | Disposition: A | Discharge status: Home / Self Care | Payer: BC Managed Care – PPO | Source: Ambulatory Visit | Attending: Surgery | Admitting: Surgery

## 2013-07-15 ENCOUNTER — Encounter (INDEPENDENT_AMBULATORY_CARE_PROVIDER_SITE_OTHER): Payer: BC Managed Care – PPO | Admitting: Surgery

## 2013-07-15 DIAGNOSIS — R1011 Right upper quadrant pain: Secondary | ICD-10-CM

## 2013-07-18 ENCOUNTER — Other Ambulatory Visit (INDEPENDENT_AMBULATORY_CARE_PROVIDER_SITE_OTHER): Payer: Self-pay

## 2013-07-18 DIAGNOSIS — R1011 Right upper quadrant pain: Secondary | ICD-10-CM

## 2013-07-19 ENCOUNTER — Other Ambulatory Visit: Payer: BC Managed Care – PPO

## 2013-07-20 ENCOUNTER — Other Ambulatory Visit: Payer: BC Managed Care – PPO

## 2013-07-21 ENCOUNTER — Ambulatory Visit
Admission: RE | Admit: 2013-07-21 | Discharge: 2013-07-21 | Disposition: A | Discharge status: Home / Self Care | Payer: BC Managed Care – PPO | Source: Ambulatory Visit | Attending: Surgery | Admitting: Surgery

## 2013-07-21 DIAGNOSIS — R1011 Right upper quadrant pain: Secondary | ICD-10-CM

## 2013-07-21 MED ORDER — IOHEXOL 300 MG/ML  SOLN
100.0000 mL | Freq: Once | INTRAMUSCULAR | Status: AC | PRN
  Administered 2013-07-21: 100 mL via INTRAVENOUS

## 2013-08-01 ENCOUNTER — Telehealth (INDEPENDENT_AMBULATORY_CARE_PROVIDER_SITE_OTHER): Payer: Self-pay | Admitting: *Deleted

## 2013-08-01 NOTE — Telephone Encounter (Signed)
Patient called asking about her CT results from 07/21/13.  I went ahead and explained to patient that nothing acute was seen.  Explained that they did see some acid in her stomach to indicate GERD. Encouraged patient to go ahead and get on an OTC ant-acid to try to help.  Patient is going to try Nexium and then give [place] a call on Friday if that is not helping.  Explained to patient that it can take a few days to get the medication in her system.  Patient states understanding and agreeable at this time.

## 2013-12-13 ENCOUNTER — Encounter (HOSPITAL_BASED_OUTPATIENT_CLINIC_OR_DEPARTMENT_OTHER): Payer: Self-pay | Admitting: Emergency Medicine

## 2013-12-13 ENCOUNTER — Emergency Department (HOSPITAL_BASED_OUTPATIENT_CLINIC_OR_DEPARTMENT_OTHER)
Admission: EM | Admit: 2013-12-13 | Discharge: 2013-12-13 | Disposition: A | Discharge status: Home / Self Care | Payer: Worker's Compensation | Attending: Emergency Medicine | Admitting: Emergency Medicine

## 2013-12-13 ENCOUNTER — Emergency Department (HOSPITAL_BASED_OUTPATIENT_CLINIC_OR_DEPARTMENT_OTHER): Payer: Worker's Compensation

## 2013-12-13 DIAGNOSIS — X500XXA Overexertion from strenuous movement or load, initial encounter: Secondary | ICD-10-CM | POA: Insufficient documentation

## 2013-12-13 DIAGNOSIS — Y990 Civilian activity done for income or pay: Secondary | ICD-10-CM | POA: Insufficient documentation

## 2013-12-13 DIAGNOSIS — S93402A Sprain of unspecified ligament of left ankle, initial encounter: Secondary | ICD-10-CM

## 2013-12-13 DIAGNOSIS — S93409A Sprain of unspecified ligament of unspecified ankle, initial encounter: Secondary | ICD-10-CM | POA: Insufficient documentation

## 2013-12-13 DIAGNOSIS — Z87891 Personal history of nicotine dependence: Secondary | ICD-10-CM | POA: Insufficient documentation

## 2013-12-13 DIAGNOSIS — R296 Repeated falls: Secondary | ICD-10-CM | POA: Insufficient documentation

## 2013-12-13 DIAGNOSIS — S40019A Contusion of unspecified shoulder, initial encounter: Secondary | ICD-10-CM | POA: Insufficient documentation

## 2013-12-13 DIAGNOSIS — Y9289 Other specified places as the place of occurrence of the external cause: Secondary | ICD-10-CM | POA: Insufficient documentation

## 2013-12-13 DIAGNOSIS — S40012A Contusion of left shoulder, initial encounter: Secondary | ICD-10-CM

## 2013-12-13 DIAGNOSIS — Z79899 Other long term (current) drug therapy: Secondary | ICD-10-CM | POA: Insufficient documentation

## 2013-12-13 DIAGNOSIS — F329 Major depressive disorder, single episode, unspecified: Secondary | ICD-10-CM | POA: Insufficient documentation

## 2013-12-13 DIAGNOSIS — G43909 Migraine, unspecified, not intractable, without status migrainosus: Secondary | ICD-10-CM | POA: Insufficient documentation

## 2013-12-13 DIAGNOSIS — K219 Gastro-esophageal reflux disease without esophagitis: Secondary | ICD-10-CM | POA: Insufficient documentation

## 2013-12-13 DIAGNOSIS — F3289 Other specified depressive episodes: Secondary | ICD-10-CM | POA: Insufficient documentation

## 2013-12-13 HISTORY — DX: Migraine, unspecified, not intractable, without status migrainosus: G43.909

## 2013-12-13 HISTORY — DX: Dorsalgia, unspecified: M54.9

## 2013-12-13 HISTORY — DX: Gastro-esophageal reflux disease without esophagitis: K21.9

## 2013-12-13 HISTORY — DX: Major depressive disorder, single episode, unspecified: F32.9

## 2013-12-13 HISTORY — DX: Depression, unspecified: F32.A

## 2013-12-13 NOTE — ED Notes (Signed)
Pt was at work when she stepped backward off of hardtop and fell into dirt and cement area injuring left shoulder and right ankle.  Pt is ambulatory, walking slowly.

## 2013-12-13 NOTE — Discharge Instructions (Signed)
Ankle Sprain An ankle sprain is an injury to the strong, fibrous tissues (ligaments) that hold the bones of your ankle joint together.  CAUSES An ankle sprain is usually caused by a fall or by twisting your ankle. Ankle sprains most commonly occur when you step on the outer edge of your foot, and your ankle turns inward. People who participate in sports are more prone to these types of injuries.  SYMPTOMS   Pain in your ankle. The pain may be present at rest or only when you are trying to stand or walk.  Swelling.  Bruising. Bruising may develop immediately or within 1 to 2 days after your injury.  Difficulty standing or walking, particularly when turning corners or changing directions. DIAGNOSIS  Your caregiver will ask you details about your injury and perform a physical exam of your ankle to determine if you have an ankle sprain. During the physical exam, your caregiver will press on and apply pressure to specific areas of your foot and ankle. Your caregiver will try to move your ankle in certain ways. An X-Verneda Hollopeter exam may be done to be sure a bone was not broken or a ligament did not separate from one of the bones in your ankle (avulsion fracture).  TREATMENT  Certain types of braces can help stabilize your ankle. Your caregiver can make a recommendation for this. Your caregiver may recommend the use of medicine for pain. If your sprain is severe, your caregiver may refer you to a surgeon who helps to restore function to parts of your skeletal system (orthopedist) or a physical therapist. [place] ice to your injury for 1 2 days or as directed by your caregiver. Applying ice helps to reduce inflammation and pain.  Put ice in a plastic bag.  Place a towel between your skin and the bag.  Leave the ice on for 15-20 minutes at a time, every 2 hours while you are awake.  Only take over-the-counter or prescription medicines for pain, discomfort, or fever as directed by  your caregiver.  Elevate your injured ankle above the level of your heart as much as possible for 2 3 days.  If your caregiver recommends crutches, use them as instructed. Gradually put weight on the affected ankle. Continue to use crutches or a cane until you can walk without feeling pain in your ankle.  If you have a plaster splint, wear the splint as directed by your caregiver. Do not rest it on anything harder than a pillow for the first 24 hours. Do not put weight on it. Do not get it wet. You may take it off to take a shower or bath.  You may have been given an elastic bandage to wear around your ankle to provide support. If the elastic bandage is too tight (you have numbness or tingling in your foot or your foot becomes cold and blue), adjust the bandage to make it comfortable.  If you have an air splint, you may blow more air into it or let air out to make it more comfortable. You may take your splint off at night and before taking a shower or bath. Wiggle your toes in the splint several times per day to decrease swelling. SEEK MEDICAL CARE IF:   You have rapidly increasing bruising or swelling.  Your toes feel extremely cold or you lose feeling in your foot.  Your pain is not relieved with medicine. SEEK IMMEDIATE MEDICAL CARE IF:  Your toes are numb  or blue. °· You have severe pain that is increasing. °MAKE SURE YOU:  °· Understand these instructions. °· Will watch your condition. °· Will get help right away if you are not doing well or get worse. °Document Released: 09/15/2005 Document Revised: 06/09/2012 Document Reviewed: 09/27/2011 °[employer]Care® Patient Information ©2014 [employer]. ° °Contusion °A contusion is a deep bruise. Contusions are the result of an injury that caused bleeding under the skin. The contusion may turn blue, purple, or yellow. Minor injuries will give you a painless contusion, but more severe contusions may stay painful and swollen for a few weeks.  °CAUSES  °A  contusion is usually caused by a blow, trauma, or direct force to an area of the body. °SYMPTOMS  °· Swelling and redness of the injured area. °· Bruising of the injured area. °· Tenderness and soreness of the injured area. °· Pain. °DIAGNOSIS  °The diagnosis can be made by taking a history and physical exam. An X-Lemario Chaikin, CT scan, or MRI may be needed to determine if there were any associated injuries, such as fractures. °TREATMENT  °Specific treatment will depend on what area of the body was injured. In general, the best treatment for a contusion is resting, icing, elevating, and applying cold compresses to the injured area. Over-the-counter medicines may also be recommended for pain control. Ask your caregiver what the best treatment is for your contusion. °HOME CARE INSTRUCTIONS  °· Put ice on the injured area. °· Put ice in a plastic bag. °· Place a towel between your skin and the bag. °· Leave the ice on for 15-20 minutes, 03-04 times a day. °· Only take over-the-counter or prescription medicines for pain, discomfort, or fever as directed by your caregiver. Your caregiver may recommend avoiding anti-inflammatory medicines (aspirin, ibuprofen, and naproxen) for 48 hours because these medicines may increase bruising. °· Rest the injured area. °· If possible, elevate the injured area to reduce swelling. °SEEK IMMEDIATE MEDICAL CARE IF:  °· You have increased bruising or swelling. °· You have pain that is getting worse. °· Your swelling or pain is not relieved with medicines. °MAKE SURE YOU:  °· Understand these instructions. °· Will watch your condition. °· Will get help right away if you are not doing well or get worse. °Document Released: 06/25/2005 Document Revised: 12/08/2011 Document Reviewed: 07/21/2011 °ExitCare® Patient Information ©2014 [employer]. ° °

## 2013-12-13 NOTE — ED Provider Notes (Signed)
CSN: [id number]     Arrival date & time 12/13/13  1135 History   First MD Initiated Contact with Patient 12/13/13 1159     Chief Complaint  Patient presents with  . Fall     (Consider location/radiation/quality/duration/timing/severity/associated sxs/prior Treatm ent) HPI 38 year old female who fell backwards today on uneven ground while at work. She states that she twisted her left ankle and landed on her left shoulder. She may also struck her right knee. She has been ambulatory since the event. She did not lose consciousness. She has not had a headache and has not had any vomiting. Past Medical History  Diagnosis Date  . IBS (irritable bowel syndrome)   . Back pain   . Depression   . GERD (gastroesophageal reflux disease)   . Migraines    Past Surgical History  Procedure Laterality Date  . Spine surgery    . Tonsillectomy    . Back surgery      disc rupture and fusion  . Intrauterine device insertion    . Laparoscopic gastric banding  12/30/2007  . Tympanostomy tube placement     No family history on file. History  Substance Use Topics  . Smoking status: Former Games developermoker  . Smokeless tobacco: Not on file  . Alcohol Use: Yes     Comment: rarely   OB History   Grav Para Term Preterm Abortions TAB SAB Ect Mult Living                 Review of Systems  All other systems reviewed and are negative.      Allergies  Ciprocin-fluocin-procin  Home Medications   Current Outpatient Rx  Name  Route  Sig  Dispense  Refill  . esomeprazole (NEXIUM) 40 MG capsule   Oral   Take 40 mg by mouth daily at 12 noon.         . DULoxetine (CYMBALTA) 30 MG capsule   Oral   Take 30 mg by mouth daily.         Marland Kitchen. eletriptan (RELPAX) 40 MG tablet   Oral   One tablet by mouth as needed for migraine headache.  If the headache improves and then returns, dose may be repeated after 2 hours have elapsed since first dose (do not exceed 80 mg per day). may repeat in 2 hours if necessary           . Multiple Vitamins-Minerals (MULTIVITAMIN WITH MINERALS) tablet   Oral   Take 1 tablet by mouth daily.          BP 113/70  Pulse 71  Temp(Src) 97.9 F (36.6 C) (Oral)  Resp 18  Ht 5\' 4"  (1.626 m)  Wt 180 lb (81.647 kg)  BMI 30.88 kg/m2  SpO2 98%  LMP 12/04/2013 Physical Exam  Nursing note and vitals reviewed. Constitutional: She is oriented to person, place, and time. She appears well-developed and well-nourished.  HENT:  Head: Normocephalic and atraumatic.  Right Ear: Tympanic membrane and external ear normal.  Left Ear: Tympanic membrane and external ear normal.  Nose: Nose normal. Right sinus exhibits no maxillary sinus tenderness and no frontal sinus tenderness. Left sinus exhibits no maxillary sinus tenderness and no frontal sinus tenderness.  Eyes: Conjunctivae and EOM are normal. Pupils are equal, round, and reactive to light. Right eye exhibits no nystagmus. Left eye exhibits no nystagmus.  Neck: Normal range of motion. Neck supple.  Cardiovascular: Normal rate, regular rhythm, normal heart sounds and intact distal pulses.  Pulmonary/Chest: Effort normal and breath sounds normal. No respiratory distress. She exhibits no tenderness.  Abdominal: Soft. Bowel sounds are normal. She exhibits no distension and no mass. There is no tenderness.  Musculoskeletal: Normal range of motion. She exhibits no edema and no tenderness.  Tenderness to palpation left shoulder but full active range of motion. Neurovascularly intact in the upper extremity. Elbow exam and neck exam are normal. Also has tenderness medial aspect of the left ankle. No neuro deficit is noted in the left lower extremity. The knee is normal. Both hips are normal applied to range of motion and pelvis is stable.  Neurological: She is alert and oriented to person, place, and time. She has normal strength and normal reflexes. No sensory deficit. She displays a negative Romberg sign. GCS eye subscore is 4. GCS  verbal subscore is 5. GCS motor subscore is 6.  Reflex Scores:      Tricep reflexes are 2+ on the right side and 2+ on the left side.      Bicep reflexes are 2+ on the right side and 2+ on the left side.      Brachioradialis reflexes are 2+ on the right side and 2+ on the left side.      Patellar reflexes are 2+ on the right side and 2+ on the left side.      Achilles reflexes are 2+ on the right side and 2+ on the left side. Patient with normal gait without ataxia, shuffling, spasm, or antalgia. Speech is normal without dysarthria, dysphasia, or aphasia. Muscle strength is 5/5 in bilateral shoulders, elbow flexor and extensors, wrist flexor and extensors, and intrinsic hand muscles. 5/5 bilateral lower extremity hip flexors, extensors, knee flexors and extensors, and ankle dorsi and plantar flexors.    Skin: Skin is warm and dry. No rash noted.  Psychiatric: She has a normal mood and affect. Her behavior is normal. Judgment and thought content normal.    ED Course  Procedures (including critical care time) Labs Review Labs Reviewed - No data to display Imaging Review Dg Ankle Complete Left  12/13/2013   CLINICAL DATA:  FALL  EXAM: LEFT ANKLE COMPLETE - 3+ VIEW  COMPARISON:  None.  FINDINGS: There is no evidence of fracture, dislocation, or joint effusion. There is no evidence of arthropathy or other focal bone abnormality. Soft tissues are unremarkable.  IMPRESSION: Negative.   Electronically Signed   By: Salome Holmes M.D.   On: 12/13/2013 12:48   Dg Shoulder Left  12/13/2013   CLINICAL DATA:  Fall, left shoulder pain  EXAM: LEFT SHOULDER - 2+ VIEW  COMPARISON:  None.  FINDINGS: No fracture or dislocation is seen.  The joint spaces are preserved.  The visualized soft tissues are unremarkable.  Visualized left lung is clear.  IMPRESSION: No fracture or dislocation is seen.   Electronically Signed   By: Charline Bills M.D.   On: 12/13/2013 12:57     EKG Interpretation None       MDM   Final diagnoses:  Contusion of shoulder, left  Left ankle sprain       Hilario Quarry, MD 12/13/13 1546

## 2013-12-14 ENCOUNTER — Encounter: Payer: Self-pay | Admitting: Family Medicine

## 2013-12-14 ENCOUNTER — Ambulatory Visit (INDEPENDENT_AMBULATORY_CARE_PROVIDER_SITE_OTHER): Payer: Worker's Compensation | Admitting: Family Medicine

## 2013-12-14 VITALS — BP 112/79 | HR 81 | Ht 64.0 in | Wt 180.0 lb

## 2013-12-14 DIAGNOSIS — S4980XA Other specified injuries of shoulder and upper arm, unspecified arm, initial encounter: Secondary | ICD-10-CM

## 2013-12-14 DIAGNOSIS — S93409A Sprain of unspecified ligament of unspecified ankle, initial encounter: Secondary | ICD-10-CM

## 2013-12-14 DIAGNOSIS — S46909A Unspecified injury of unspecified muscle, fascia and tendon at shoulder and upper arm level, unspecified arm, initial encounter: Secondary | ICD-10-CM

## 2013-12-14 DIAGNOSIS — S4992XA Unspecified injury of left shoulder and upper arm, initial encounter: Secondary | ICD-10-CM

## 2013-12-14 DIAGNOSIS — S93402A Sprain of unspecified ligament of left ankle, initial encounter: Secondary | ICD-10-CM

## 2013-12-14 NOTE — Patient Instructions (Signed)
You have an ankle sprain. Ice the area for 15 minutes at a time, 3-4 times a day Aleve 2 tabs twice a day with food OR ibuprofen 3 tabs three times a day with food for pain and inflammation. Elevate above the level of your heart when possible Consider laceup ankle brace to help with stability while you recover from this injury. Start theraband strengthening exercises - once a day 3 sets of 10 each direction. Consider physical therapy for strengthening and balance exercises. If not improving as expected, we may repeat x-rays or consider further testing like an MRI. Follow up with me in 2 weeks for both issues.  You have strained your rotator cuff along with nerve contusion, other upper arm muscle strains. Try to avoid painful activities (overhead activities, lifting with extended arm) as much as possible. Aleve 2 tabs twice a day with food OR ibuprofen 3 tabs three times a day with food for pain and inflammation. Can take tylenol in addition to this. Do home exercise program with theraband and scapular stabilization exercises daily - these are very important for long term relief. If not improving over next 4-6 weeks we will consider further imaging, physical therapy, injection, nitro patches.

## 2013-12-15 ENCOUNTER — Encounter: Payer: Self-pay | Admitting: Family Medicine

## 2013-12-15 DIAGNOSIS — S93402A Sprain of unspecified ligament of left ankle, initial encounter: Secondary | ICD-10-CM | POA: Insufficient documentation

## 2013-12-15 DIAGNOSIS — S4992XA Unspecified injury of left shoulder and upper arm, initial encounter: Secondary | ICD-10-CM | POA: Insufficient documentation

## 2013-12-15 NOTE — Assessment & Plan Note (Signed)
2/2 rotator cuff strain though also sore in biceps and triceps muscles.  NSAIDs, tylenol as needed.  Start HEP which was reviewed today.  F/u in 2 weeks.  If not improving over next 4-6 weeks we will consider further imaging, physical therapy, injection, nitro patches.

## 2013-12-15 NOTE — Progress Notes (Signed)
Patient ID: Kimberly Simpson, female   DOB: 07/30/1976, 38 y.o.   MRN: [id number]  PCP: Beverley FiedlerANKINS,VICTORIA, MD  Subjective:   HPI: Patient is a 38 y.o. female here for left shoulder, ankle injuries.  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 Heard/felt popping of left ankle and slight swelling. No prior left shoulder or ankle injuries. Could bear weight after this. Had radiographs in ED of shoulder and ankle negative for fracture.  Past Medical History  Diagnosis Date  . IBS (irritable bowel syndrome)   . Back pain   . Depression   . GERD (gastroesophageal reflux disease)   . Migraines     Current Outpatient Prescriptions on File Prior to Visit  Medication Sig Dispense Refill  . DULoxetine (CYMBALTA) 30 MG capsule Take 30 mg by mouth daily.      Marland Kitchen. eletriptan (RELPAX) 40 MG tablet One tablet by mouth as needed for migraine headache.  If the headache improves and then returns, dose may be repeated after 2 hours have elapsed since first dose (do not exceed 80 mg per day). may repeat in 2 hours if necessary       . esomeprazole (NEXIUM) 40 MG capsule Take 40 mg by mouth daily at 12 noon.      . Multiple Vitamins-Minerals (MULTIVITAMIN WITH MINERALS) tablet Take 1 tablet by mouth daily.       No current facility-administered medications on file prior to visit.    Past Surgical History  Procedure Laterality Date  . Spine surgery    . Tonsillectomy    . Back surgery      disc rupture and fusion  . Intrauterine device insertion    . Laparoscopic gastric banding  12/30/2007  . Tympanostomy tube placement      Allergies  Allergen Reactions  . Ciprocin-Fluocin-Procin [Fluocinolone Acetonide]     History   Social History  . Marital Status: Widowed    Spouse Name: N/A    Number of Children: N/A  . Years of  Education: N/A   Occupational History  . Not on file.   Social History Main Topics  . Smoking status: Former Games developermoker  . Smokeless tobacco: Not on file  . Alcohol Use: Yes     Comment: rarely  . Drug Use: No  . Sexual Activity: Yes    Birth Control/ Protection: IUD   Other Topics Concern  . Not on file   Social History Narrative  . No narrative on file    Family History  Problem Relation Age of Onset  . Hyperlipidemia Mother   . Hypertension Mother   . Hyperlipidemia Father     BP 112/79  Pulse 81  Ht 5\' 4"  (1.626 m)  Wt 180 lb (81.647 kg)  BMI 30.88 kg/m2  LMP 12/04/2013  Review of Systems: See HPI above.    Objective:  Physical Exam:  Gen: NAD  Left shoulder: No swelling, ecchymoses.  No gross deformity. TTP triceps, biceps proximally, anterior shoulder. FROM with painful arc. Positive Hawkins, Neers. Negative Speeds, Yergasons. Strength 5/5 with empty can and resisted internal/external rotation.  Painful empty can. Negative apprehension. NV intact distally.  Left ankle/foot: Mild lateral swelling, no bruising.  No other deformity. FROM with pain on ext rotation, mild. TTP over ATFL.  No other TTP. 1+  ant drawer and talar tilt.  Painful talar tilt. Negative syndesmotic compression. Thompsons test negative. NV intact distally.    Assessment & Plan:  1. Left ankle sprain- start ROM and strengthening exercises.  Icing, elevation, nsaids as needed.  F/u in 2 weeks for reevaluation.  Consider ASO.  2. Left shoulder injury - 2/2 rotator cuff strain though also sore in biceps and triceps muscles.  NSAIDs, tylenol as needed.  Start HEP which was reviewed today.  F/u in 2 weeks.  If not improving over next 4-6 weeks we will consider further imaging, physical therapy, injection, nitro patches.

## 2013-12-15 NOTE — Assessment & Plan Note (Signed)
start ROM and strengthening exercises.  Icing, elevation, nsaids as needed.  F/u in 2 weeks for reevaluation.  Consider ASO.

## 2013-12-28 ENCOUNTER — Encounter: Payer: Self-pay | Admitting: Family Medicine

## 2013-12-28 ENCOUNTER — Ambulatory Visit (INDEPENDENT_AMBULATORY_CARE_PROVIDER_SITE_OTHER): Payer: Worker's Compensation | Admitting: Family Medicine

## 2013-12-28 VITALS — BP 115/80 | HR 90 | Ht 64.0 in | Wt 180.0 lb

## 2013-12-28 DIAGNOSIS — S4980XA Other specified injuries of shoulder and upper arm, unspecified arm, initial encounter: Secondary | ICD-10-CM

## 2013-12-28 DIAGNOSIS — S93409A Sprain of unspecified ligament of unspecified ankle, initial encounter: Secondary | ICD-10-CM

## 2013-12-28 DIAGNOSIS — S4992XA Unspecified injury of left shoulder and upper arm, initial encounter: Secondary | ICD-10-CM

## 2013-12-28 DIAGNOSIS — S93402A Sprain of unspecified ligament of left ankle, initial encounter: Secondary | ICD-10-CM

## 2013-12-28 DIAGNOSIS — S46909A Unspecified injury of unspecified muscle, fascia and tendon at shoulder and upper arm level, unspecified arm, initial encounter: Secondary | ICD-10-CM

## 2013-12-28 NOTE — Patient Instructions (Signed)
You have strained your rotator cuff along with nerve contusion, other upper arm muscle strains. Start physical therapy now and do home exercises on days you don't go to therapy. Try to avoid painful activities (overhead activities, lifting with extended arm) as much as possible. Aleve 2 tabs twice a day with food OR ibuprofen 3 tabs three times a day with food for pain and inflammation. Heat as needed for spasms 15 minutes at a time.  Typically ice after exercises for 15 minutes though. Can take tylenol in addition to this. Follow up with me in 4 weeks.  If not improving would consider MRI at that point.

## 2014-01-03 ENCOUNTER — Encounter: Payer: Self-pay | Admitting: Family Medicine

## 2014-01-03 NOTE — Progress Notes (Signed)
Patient ID: Kimberly Simpson, female   DOB: 07/24/1976, 38 y.o.   MRN: [id number]  PCP: Beverley FiedlerANKINS,VICTORIA, MD  Subjective:   HPI: Patient is a 38 y.o. female here for left shoulder, ankle injuries.  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 Heard/felt popping of left ankle and slight swelling. No prior left shoulder or ankle injuries. Could bear weight after this. Had radiographs in ED of shoulder and ankle negative for fracture.  4/1: Patient reports at this point her left ankle is completely better. However the left shoulder is still painful. Pops a lot. Burning sensation into shoulder anterior to posterior. Arm still goes numb. Rare night pain. Using heating pad and taking ibuprofen.  Past Medical History  Diagnosis Date  . IBS (irritable bowel syndrome)   . Back pain   . Depression   . GERD (gastroesophageal reflux disease)   . Migraines     Current Outpatient Prescriptions on File Prior to Visit  Medication Sig Dispense Refill  . DULoxetine (CYMBALTA) 30 MG capsule Take 30 mg by mouth daily.      Marland Kitchen. eletriptan (RELPAX) 40 MG tablet One tablet by mouth as needed for migraine headache.  If the headache improves and then returns, dose may be repeated after 2 hours have elapsed since first dose (do not exceed 80 mg per day). may repeat in 2 hours if necessary       . esomeprazole (NEXIUM) 40 MG capsule Take 40 mg by mouth daily at 12 noon.      . Multiple Vitamins-Minerals (MULTIVITAMIN WITH MINERALS) tablet Take 1 tablet by mouth daily.       No current facility-administered medications on file prior to visit.    Past Surgical History  Procedure Laterality Date  . Spine surgery    . Tonsillectomy    . Back surgery      disc rupture and fusion  . Intrauterine device insertion    .  Laparoscopic gastric banding  12/30/2007  . Tympanostomy tube placement      Allergies  Allergen Reactions  . Ciprocin-Fluocin-Procin [Fluocinolone Acetonide]     History   Social History  . Marital Status: Widowed    Spouse Name: N/A    Number of Children: N/A  . Years of Education: N/A   Occupational History  . Not on file.   Social History Main Topics  . Smoking status: Former Games developermoker  . Smokeless tobacco: Not on file  . Alcohol Use: Yes     Comment: rarely  . Drug Use: No  . Sexual Activity: Yes    Birth Control/ Protection: IUD   Other Topics Concern  . Not on file   Social History Narrative  . No narrative on file    Family History  Problem Relation Age of Onset  . Hyperlipidemia Mother   . Hypertension Mother   . Hyperlipidemia Father     BP 115/80  Pulse 90  Ht 5\' 4"  (1.626 m)  Wt 180 lb (81.647 kg)  BMI 30.88 kg/m2  LMP 12/04/2013  Review of Systems: See HPI above.    Objective:  Physical Exam:  Gen: NAD  Left shoulder: No swelling, ecchymoses.  No gross deformity. TTP anterior shoulder, less biceps and triceps. FROM with painful arc. Positive Hawkins, Neers. Negative Speeds, Yergasons.  Strength 5/5 with empty can and resisted internal/external rotation.  Painful empty can. Negative apprehension. NV intact distally.  Left ankle/foot: Minimal lateral swelling, no bruising.  No other deformity. FROM. No TTP. 1+ ant drawer and talar tilt.  NV intact distally.    Assessment & Plan:  1. Left shoulder injury - 2/2 rotator cuff strain along with nerve contusion.  Will start physical therapy and home exercises.  NSAIDs, tylenol as needed.  Start HEP which was reviewed today.  Heat as needed for spasms.  F/u in 4 weeks.  Consider MRI if not improving.  2. Left ankle sprain- significant improvement.  Theraband strengthening exercises.

## 2014-01-03 NOTE — Assessment & Plan Note (Signed)
2/2 rotator cuff strain along with nerve contusion.  Will start physical therapy and home exercises.  NSAIDs, tylenol as needed.  Start HEP which was reviewed today.  Heat as needed for spasms.  F/u in 4 weeks.  Consider MRI if not improving.

## 2014-01-03 NOTE — Assessment & Plan Note (Signed)
significant improvement.  Theraband strengthening exercises.

## 2014-01-25 ENCOUNTER — Ambulatory Visit (INDEPENDENT_AMBULATORY_CARE_PROVIDER_SITE_OTHER): Payer: Worker's Compensation | Admitting: Family Medicine

## 2014-01-25 ENCOUNTER — Encounter: Payer: Self-pay | Admitting: Family Medicine

## 2014-01-25 VITALS — BP 117/79 | HR 101 | Ht 64.0 in | Wt 180.0 lb

## 2014-01-25 DIAGNOSIS — S46909A Unspecified injury of unspecified muscle, fascia and tendon at shoulder and upper arm level, unspecified arm, initial encounter: Secondary | ICD-10-CM

## 2014-01-25 DIAGNOSIS — S4980XA Other specified injuries of shoulder and upper arm, unspecified arm, initial encounter: Secondary | ICD-10-CM

## 2014-01-25 DIAGNOSIS — S4992XA Unspecified injury of left shoulder and upper arm, initial encounter: Secondary | ICD-10-CM

## 2014-01-25 NOTE — Patient Instructions (Signed)
We will go ahead with an MRI of your left shoulder. I will call you the business day following your MRI to go over results. If you haven't heard from me by the second day, give me a call.

## 2014-01-26 ENCOUNTER — Encounter: Payer: Self-pay | Admitting: Family Medicine

## 2014-01-26 NOTE — Assessment & Plan Note (Signed)
She has done over 6 weeks now of HEP, nsaids without improvement.  Still bothering a lot at nighttime.  Will go ahead with MRI at this point to assess for rotator cuff tear.

## 2014-01-26 NOTE — Progress Notes (Signed)
Patient ID: Kimberly Simpson, female   DOB: 05/29/1976, 38 y.o.   MRN: [id number]  PCP: Beverley FiedlerANKINS,VICTORIA, MD  Subjective:   HPI: Patient is a 38 y.o. female here for left shoulder, ankle injuries.  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 Heard/felt popping of left ankle and slight swelling. No prior left shoulder or ankle injuries. Could bear weight after this. Had radiographs in ED of shoulder and ankle negative for fracture.  4/1: Patient reports at this point her left ankle is completely better. However the left shoulder is still painful. Pops a lot. Burning sensation into shoulder anterior to posterior. Arm still goes numb. Rare night pain. Using heating pad and taking ibuprofen.  4/29: Patient reports her left shoulder is still bothering her quite a bit. Keeps her up at night, can't lie on left shoulder. Taking ibuprofen. Reaching activities bother a lot. Doing HEP - was never called about physical therapy.  Past Medical History  Diagnosis Date  . IBS (irritable bowel syndrome)   . Back pain   . Depression   . GERD (gastroesophageal reflux disease)   . Migraines     Current Outpatient Prescriptions on File Prior to Visit  Medication Sig Dispense Refill  . DULoxetine (CYMBALTA) 30 MG capsule Take 30 mg by mouth daily.      Marland Kitchen. eletriptan (RELPAX) 40 MG tablet One tablet by mouth as needed for migraine headache.  If the headache improves and then returns, dose may be repeated after 2 hours have elapsed since first dose (do not exceed 80 mg per day). may repeat in 2 hours if necessary       . esomeprazole (NEXIUM) 40 MG capsule Take 40 mg by mouth daily at 12 noon.      . Multiple Vitamins-Minerals (MULTIVITAMIN WITH MINERALS) tablet Take 1 tablet by mouth daily.       No current  facility-administered medications on file prior to visit.    Past Surgical History  Procedure Laterality Date  . Spine surgery    . Tonsillectomy    . Back surgery      disc rupture and fusion  . Intrauterine device insertion    . Laparoscopic gastric banding  12/30/2007  . Tympanostomy tube placement      Allergies  Allergen Reactions  . Ciprocin-Fluocin-Procin [Fluocinolone Acetonide]     History   Social History  . Marital Status: Widowed    Spouse Name: N/A    Number of Children: N/A  . Years of Education: N/A   Occupational History  . Not on file.   Social History Main Topics  . Smoking status: Former Games developermoker  . Smokeless tobacco: Not on file  . Alcohol Use: Yes     Comment: rarely  . Drug Use: No  . Sexual Activity: Yes    Birth Control/ Protection: IUD   Other Topics Concern  . Not on file   Social History Narrative  . No narrative on file    Family History  Problem Relation Age of Onset  . Hyperlipidemia Mother   . Hypertension Mother   . Hyperlipidemia Father     BP 117/79  Pulse 101  Ht 5\' 4"  (1.626 m)  Wt 180 lb (81.647 kg)  BMI 30.88 kg/m2  Review of Systems: See HPI above.  Objective:  Physical Exam:  Gen: NAD  Left shoulder: No swelling, ecchymoses.  No gross deformity. TTP anterior shoulder, less biceps and triceps. FROM with painful arc. Positive Hawkins, Neers. Negative Speeds, Yergasons. Strength 5/5 with empty can and resisted internal/external rotation.  Painful empty can. Negative apprehension. NV intact distally.  Assessment & Plan:  1. Left shoulder injury - She has done over 6 weeks now of HEP, nsaids without improvement.  Still bothering a lot at nighttime.  Will go ahead with MRI at this point to assess for rotator cuff tear.    2. Left ankle sprain- resolved

## 2014-03-09 ENCOUNTER — Encounter (INDEPENDENT_AMBULATORY_CARE_PROVIDER_SITE_OTHER): Payer: Self-pay

## 2014-03-09 ENCOUNTER — Ambulatory Visit (INDEPENDENT_AMBULATORY_CARE_PROVIDER_SITE_OTHER): Payer: BC Managed Care – PPO | Admitting: Physician Assistant

## 2014-03-09 VITALS — BP 130/78 | HR 72 | Temp 98.0°F | Resp 14 | Ht 64.0 in | Wt 200.0 lb

## 2014-03-09 DIAGNOSIS — Z9884 Bariatric surgery status: Secondary | ICD-10-CM

## 2014-03-09 DIAGNOSIS — K219 Gastro-esophageal reflux disease without esophagitis: Secondary | ICD-10-CM

## 2014-03-09 MED ORDER — ESOMEPRAZOLE MAGNESIUM 40 MG PO CPDR: 40.0000 mg | DELAYED_RELEASE_CAPSULE | Freq: Every day | ORAL | Status: DC

## 2014-03-09 NOTE — Patient Instructions (Signed)
Take Nexium as prescribed. Obtain your upper GI. Take Gaviscon as directed for GERD symptoms. Return next week.

## 2014-03-09 NOTE — Progress Notes (Signed)
  HISTORY: Kimberly Simpson is a 38 y.o.female who received an AP-Standard lap-band in April 2009 by Dr. Daphine Deutscher. She comes in with 19 lbs weight gain since her last visit in October, and total of 34 lbs weight loss since surgery. She is complaining of persistent GERD symptoms, unsuccessfully treated with Nexium 40 mg and Tums PRN. Despite these symptoms, she complains of hunger and increasing portion sizes. She has nocturnal GERD. She had a CT Abdomen late last year which indicated gastric secretions in the esophagus but no other pathology to explain her RUQ pain.  VITAL SIGNS: Filed Vitals:   03/09/14 1405  BP: 130/78  Pulse: 72  Temp: 98 F (36.7 C)  Resp: 14    PHYSICAL EXAM: Physical exam reveals a very well-appearing 38 y.o.female in no apparent distress Neurologic: Awake, alert, oriented Psych: Bright affect, conversant Respiratory: Breathing even and unlabored. No stridor or wheezing Extremities: Atraumatic, good range of motion. Skin: Warm, Dry, no rashes Musculoskeletal: Normal gait, Joints normal  ASSESMENT: 38 y.o.  female  s/p AP-Standard lap-band.   PLAN: We discussed the picture at length. While she does have significant hunger and is gaining weight (and has no complaint of solid food dysphagia), her persistent GERD in this setting is concerning. I've ordered an UGI to evaluate her tract before making any decisions on band adjustments. Slip is on the differential. She will return next week after her study. Meanwhile, I've prescribed nexium and recommended Gaviscon for symptomatic relief.

## 2014-03-13 NOTE — Progress Notes (Signed)
Agree with plan  Poet Hineman M. Vontae Court, MD, FACS General, Bariatric, & Minimally Invasive Surgery [facility]  

## 2014-03-15 ENCOUNTER — Ambulatory Visit
Admission: RE | Admit: 2014-03-15 | Discharge: 2014-03-15 | Disposition: A | Discharge status: Home / Self Care | Payer: BC Managed Care – PPO | Source: Ambulatory Visit | Attending: Physician Assistant | Admitting: Physician Assistant

## 2014-03-15 ENCOUNTER — Telehealth (INDEPENDENT_AMBULATORY_CARE_PROVIDER_SITE_OTHER): Payer: Self-pay

## 2014-03-15 ENCOUNTER — Other Ambulatory Visit (INDEPENDENT_AMBULATORY_CARE_PROVIDER_SITE_OTHER): Payer: Self-pay | Admitting: Physician Assistant

## 2014-03-15 DIAGNOSIS — Z9884 Bariatric surgery status: Secondary | ICD-10-CM

## 2014-03-15 DIAGNOSIS — K219 Gastro-esophageal reflux disease without esophagitis: Secondary | ICD-10-CM

## 2014-03-15 NOTE — Telephone Encounter (Signed)
Lap band patient who had CT scan which shows lap band not working correctly.  Contrast would not go through.  [facility] Molli [facility]ansell [facility] calling to make Kimberly [name] Liepens, PA aware.  Pt has appt with Mardelle MatteAndy on 03/16/14.

## 2014-03-16 ENCOUNTER — Encounter (INDEPENDENT_AMBULATORY_CARE_PROVIDER_SITE_OTHER): Payer: Self-pay

## 2014-03-16 ENCOUNTER — Ambulatory Visit (INDEPENDENT_AMBULATORY_CARE_PROVIDER_SITE_OTHER): Payer: BC Managed Care – PPO | Admitting: Physician Assistant

## 2014-03-16 VITALS — BP 110/66 | HR 72 | Temp 98.0°F | Resp 14 | Ht 64.0 in | Wt 196.0 lb

## 2014-03-16 DIAGNOSIS — Z4651 Encounter for fitting and adjustment of gastric lap band: Secondary | ICD-10-CM

## 2014-03-16 NOTE — Progress Notes (Signed)
  HISTORY: Kimberly Simpson is a 38 y.o.female who received an AP-Standard lap-band in April 2009 by Dr. Daphine DeutscherMartin. She comes in with 4 lbs further weight loss since her last visit one week ago and 38 lbs weight loss since surgery. Despite increasing her PPI dosage, she still has reflux at night and a feeling of retained food while burping during the day. Otherwise she still complains of hunger and being able to eat too much. She attended her upper GI study yesterday.  VITAL SIGNS: Filed Vitals:   03/16/14 1125  BP: 110/66  Pulse: 72  Temp: 98 F (36.7 C)  Resp: 14    PHYSICAL EXAM: Physical exam reveals a very well-appearing 38 y.o.female in no apparent distress Neurologic: Awake, alert, oriented Psych: Bright affect, conversant Respiratory: Breathing even and unlabored. No stridor or wheezing Abdomen: Soft, nontender, nondistended to palpation. Incisions well-healed. No incisional hernias. Port easily palpated. Extremities: Atraumatic, good range of motion.  ASSESMENT: 38 y.o.  female  s/p AP-Standard lap-band with evidence of slip.   PLAN: I reviewed the report from her upper GI yesterday as well as the films. There is pretty strong evidence of a slip as the contrast enters the pouch with some evidence of band tilt, but does not progress into the body of the stomach. This may explain her symptoms. I discussed these findings with the patient who voiced understanding. We will start with conservative measures, namely removal of all fluid and following her symptoms. The patient's port was accessed with a 20G Huber needle without difficulty. Clear fluid was aspirated and 5.25 mL saline was removed from the port to give a total predicted volume of 0 mL. The patient was instructed to take liquids for the next couple of days and slowly advance as tolerated in the hopes of the slip reducing on its own. If she continues to have symptoms into next week, I advised her to contact the office as she may  need surgical therapy to correct her problem.

## 2014-03-16 NOTE — Patient Instructions (Signed)
Take clear liquids for the next day or two. If you have continued reflux symptoms or the sensation of retained food into next week, please call our office and ask to speak with Rivka BarbaraGlenda, Dr. Ermalene SearingMartin's nurse. Otherwise, slowly and carefully advance your diet. Return in three weeks.

## 2014-03-20 ENCOUNTER — Telehealth (INDEPENDENT_AMBULATORY_CARE_PROVIDER_SITE_OTHER): Payer: Self-pay

## 2014-03-20 NOTE — Telephone Encounter (Signed)
Called and spoke to patient regarding appointment for lap band.  Patient scheduled to come in on 03/21/14 @ 1:45 pm to see Dr. Daphine DeutscherMartin.

## 2014-03-20 NOTE — Telephone Encounter (Signed)
Message copied by Maryan PulsMOORE, Candies Palm on Mon Mar 20, 2014  4:16 PM ------      Message from: Kerrin ChampagneLOWERY, TENA M      Created: Mon Mar 20, 2014 10:04 AM      Contact: [phone]       Pt having problems with lap band fill she was told to call in if she still having problems to get an appt with Dr. Daphine DeutscherMartin ------

## 2014-03-21 ENCOUNTER — Other Ambulatory Visit (HOSPITAL_COMMUNITY): Admission: RE | Admit: 2014-03-21 | Payer: BC Managed Care – PPO | Source: Ambulatory Visit | Admitting: Family Medicine

## 2014-03-21 ENCOUNTER — Ambulatory Visit (INDEPENDENT_AMBULATORY_CARE_PROVIDER_SITE_OTHER): Payer: BC Managed Care – PPO | Admitting: Surgery

## 2014-03-21 ENCOUNTER — Other Ambulatory Visit (HOSPITAL_COMMUNITY)
Admission: RE | Admit: 2014-03-21 | Discharge: 2014-03-21 | Disposition: A | Discharge status: Home / Self Care | Payer: BC Managed Care – PPO | Source: Ambulatory Visit | Attending: Family Medicine | Admitting: Family Medicine

## 2014-03-21 ENCOUNTER — Encounter (HOSPITAL_COMMUNITY): Payer: Self-pay | Admitting: *Deleted

## 2014-03-21 ENCOUNTER — Other Ambulatory Visit: Payer: Self-pay | Admitting: Family Medicine

## 2014-03-21 ENCOUNTER — Encounter (HOSPITAL_COMMUNITY): Payer: Self-pay | Admitting: Pharmacy Technician

## 2014-03-21 ENCOUNTER — Encounter (INDEPENDENT_AMBULATORY_CARE_PROVIDER_SITE_OTHER): Payer: Self-pay | Admitting: Surgery

## 2014-03-21 ENCOUNTER — Other Ambulatory Visit (INDEPENDENT_AMBULATORY_CARE_PROVIDER_SITE_OTHER): Payer: Self-pay | Admitting: Surgery

## 2014-03-21 VITALS — BP 98/68 | HR 64 | Temp 97.4°F | Resp 14 | Ht 64.0 in | Wt 197.2 lb

## 2014-03-21 DIAGNOSIS — T85698A Other mechanical complication of other specified internal prosthetic devices, implants and grafts, initial encounter: Secondary | ICD-10-CM

## 2014-03-21 DIAGNOSIS — Z1151 Encounter for screening for human papillomavirus (HPV): Secondary | ICD-10-CM | POA: Insufficient documentation

## 2014-03-21 DIAGNOSIS — Z124 Encounter for screening for malignant neoplasm of cervix: Secondary | ICD-10-CM | POA: Insufficient documentation

## 2014-03-21 DIAGNOSIS — IMO0001 Reserved for inherently not codable concepts without codable children: Secondary | ICD-10-CM

## 2014-03-21 DIAGNOSIS — T85528A Displacement of other gastrointestinal prosthetic devices, implants and grafts, initial encounter: Principal | ICD-10-CM

## 2014-03-21 NOTE — Patient Instructions (Signed)

## 2014-03-21 NOTE — Progress Notes (Signed)
Chief Complaint:  Anterior lapband slip  History of Present Illness:  Kimberly Simpson is an 38 y.o. female with intermittent dysphagia to solids and liquids. An upper GI shows effacement of her lap band. This is all consistent with an anterior slip. I discussed laparoscopic LAP-BAND revision including the possibility that we might have to remove her band.   Past Medical History  Diagnosis Date  . IBS (irritable bowel syndrome)   . Back pain   . Depression   . GERD (gastroesophageal reflux disease)   . Migraines     Past Surgical History  Procedure Laterality Date  . Spine surgery    . Tonsillectomy    . Back surgery      disc rupture and fusion  . Intrauterine device insertion    . Laparoscopic gastric banding  12/30/2007  . Tympanostomy tube placement      Current Outpatient Prescriptions  Medication Sig Dispense Refill  . DULoxetine (CYMBALTA) 30 MG capsule Take 30 mg by mouth daily.      Marland Kitchen. eletriptan (RELPAX) 40 MG tablet One tablet by mouth as needed for migraine headache.  If the headache improves and then returns, dose may be repeated after 2 hours have elapsed since first dose (do not exceed 80 mg per day). may repeat in 2 hours if necessary       . esomeprazole (NEXIUM) 40 MG capsule Take 1 capsule (40 mg total) by mouth daily at 12 noon.  30 capsule  1  . magnesium 30 MG tablet Take 30 mg by mouth 2 (two) times daily.      . Multiple Vitamins-Minerals (MULTIVITAMIN WITH MINERALS) tablet Take 1 tablet by mouth daily.       No current facility-administered medications for this visit.   Ciprocin-fluocin-procin Family History  Problem Relation Age of Onset  . Hyperlipidemia Mother   . Hypertension Mother   . Hyperlipidemia Father    Social History:   reports that she has quit smoking. She does not have any smokeless tobacco history on file. She reports that she drinks alcohol. She reports that she does not use illicit drugs.   REVIEW OF SYSTEMS : Positive for  intermittent dysphagia to solids and liquids ; otherwise negative  Physical Exam:   Blood pressure 98/68, pulse 64, temperature 97.4 F (36.3 C), temperature source Temporal, resp. rate 14, height 5\' 4"  (1.626 m), weight 197 lb 3.2 oz (89.449 kg). Body mass index is 33.83 kg/(m^2).  Gen:  WDWN WF NAD  Neurological: Alert and oriented to person, place, and time. Motor and sensory function is grossly intact  Head: Normocephalic and atraumatic.  Eyes: Conjunctivae are normal. Pupils are equal, round, and reactive to light. No scleral icterus.  Neck: Normal range of motion. Neck supple. No tracheal deviation or thyromegaly present.  Cardiovascular:  SR without murmurs or gallops.  No carotid bruits Breast:  Not examined Respiratory: Effort normal.  No respiratory distress. No chest wall tenderness. Breath sounds normal.  No wheezes, rales or rhonchi.  Abdomen:  nontender GU: not examined Musculoskeletal: Normal range of motion. Extremities are nontender. No cyanosis, edema or clubbing noted Lymphadenopathy: No cervical, preauricular, postauricular or axillary adenopathy is present Skin: Skin is warm and dry. No rash noted. No diaphoresis. No erythema. No pallor. Pscyh: Normal mood and affect. Behavior is normal. Judgment and thought content normal.   LABORATORY RESULTS: No results found for this or any previous visit (from the past 48 hour(s)).   RADIOLOGY RESULTS: No results  found.  Problem List: Patient Active Problem List   Diagnosis Date Noted  . Left ankle sprain 12/15/2013  . Injury of left shoulder 12/15/2013  . [facility] 11/02/2012    Assessment & Plan: Anterior slip of APS lapband.  Plan revision of lapband.     Matt B. Daphine DeutscherMartin, MD, [facility] [place]  [facility] [phone] beeper [phone]  03/21/2014 2:21 PM

## 2014-03-22 ENCOUNTER — Inpatient Hospital Stay (HOSPITAL_COMMUNITY)
Admission: RE | Admit: 2014-03-22 | Discharge: 2014-03-24 | DRG: 989 | Disposition: A | Discharge status: Home / Self Care | Payer: BC Managed Care – PPO | Source: Ambulatory Visit | Attending: Surgery | Admitting: Surgery

## 2014-03-22 ENCOUNTER — Encounter (HOSPITAL_COMMUNITY): Payer: Self-pay | Admitting: *Deleted

## 2014-03-22 ENCOUNTER — Encounter (HOSPITAL_COMMUNITY)
Admission: RE | Disposition: A | Discharge status: Home / Self Care | Payer: Self-pay | Source: Ambulatory Visit | Attending: Surgery

## 2014-03-22 ENCOUNTER — Encounter (HOSPITAL_COMMUNITY): Payer: BC Managed Care – PPO | Admitting: Certified Registered"

## 2014-03-22 ENCOUNTER — Ambulatory Visit (HOSPITAL_COMMUNITY): Payer: BC Managed Care – PPO | Admitting: Certified Registered"

## 2014-03-22 DIAGNOSIS — Z8249 Family history of ischemic heart disease and other diseases of the circulatory system: Secondary | ICD-10-CM

## 2014-03-22 DIAGNOSIS — K589 Irritable bowel syndrome without diarrhea: Secondary | ICD-10-CM | POA: Diagnosis present

## 2014-03-22 DIAGNOSIS — Y831 Surgical operation with implant of artificial internal device as the cause of abnormal reaction of the patient, or of later complication, without mention of misadventure at the time of the procedure: Secondary | ICD-10-CM | POA: Diagnosis present

## 2014-03-22 DIAGNOSIS — T85898A Other specified complication of other internal prosthetic devices, implants and grafts, initial encounter: Secondary | ICD-10-CM

## 2014-03-22 DIAGNOSIS — Z9884 Bariatric surgery status: Secondary | ICD-10-CM

## 2014-03-22 DIAGNOSIS — F329 Major depressive disorder, single episode, unspecified: Secondary | ICD-10-CM | POA: Diagnosis present

## 2014-03-22 DIAGNOSIS — R1310 Dysphagia, unspecified: Secondary | ICD-10-CM | POA: Diagnosis not present

## 2014-03-22 DIAGNOSIS — K219 Gastro-esophageal reflux disease without esophagitis: Secondary | ICD-10-CM | POA: Diagnosis present

## 2014-03-22 DIAGNOSIS — Z79899 Other long term (current) drug therapy: Secondary | ICD-10-CM

## 2014-03-22 DIAGNOSIS — Z87891 Personal history of nicotine dependence: Secondary | ICD-10-CM

## 2014-03-22 DIAGNOSIS — Z4651 Encounter for fitting and adjustment of gastric lap band: Secondary | ICD-10-CM

## 2014-03-22 DIAGNOSIS — F3289 Other specified depressive episodes: Secondary | ICD-10-CM | POA: Diagnosis present

## 2014-03-22 DIAGNOSIS — K9509 Other complications of gastric band procedure: Principal | ICD-10-CM

## 2014-03-22 DIAGNOSIS — M25519 Pain in unspecified shoulder: Secondary | ICD-10-CM | POA: Diagnosis not present

## 2014-03-22 HISTORY — PX: UPPER GI ENDOSCOPY: SHX6162

## 2014-03-22 HISTORY — DX: Adverse effect of unspecified anesthetic, initial encounter: T41.45XA

## 2014-03-22 HISTORY — PX: LAPAROSCOPIC REVISION OF GASTRIC BAND: SHX5921

## 2014-03-22 HISTORY — DX: Other complications of anesthesia, initial encounter: T88.59XA

## 2014-03-22 LAB — BASIC METABOLIC PANEL
BUN: 12 mg/dL (ref 6–23)
CALCIUM: 8.7 mg/dL (ref 8.4–10.5)
CHLORIDE: 105 meq/L (ref 96–112)
CO2: 27 mEq/L (ref 19–32)
Creatinine, Ser: 0.72 mg/dL (ref 0.50–1.10)
GFR calc non Af Amer: >90 mL/min (ref >90)
Glucose, Bld: 108 mg/dL — ABNORMAL HIGH (ref 70–99)
Potassium: 4 mEq/L (ref 3.7–5.3)
Sodium: 143 mEq/L (ref 137–147)

## 2014-03-22 LAB — HCG, SERUM, QUALITATIVE: PREG SERUM: NEGATIVE

## 2014-03-22 LAB — CBC
HCT: 38.5 % (ref 36.0–46.0)
Hemoglobin: 13 g/dL (ref 12.0–15.0)
MCH: 29.3 pg (ref 26.0–34.0)
MCHC: 33.8 g/dL (ref 30.0–36.0)
MCV: 86.9 fL (ref 78.0–100.0)
PLATELETS: 257 10*3/uL (ref 150–400)
RBC: 4.43 MIL/uL (ref 3.87–5.11)
RDW: 13.5 % (ref 11.5–15.5)
WBC: 5 10*3/uL (ref 4.0–10.5)

## 2014-03-22 LAB — CYTOLOGY - PAP

## 2014-03-22 SURGERY — REVISION, GASTRIC BAND, LAPAROSCOPIC
Anesthesia: General | Site: Abdomen

## 2014-03-22 MED ORDER — PROMETHAZINE HCL 25 MG/ML IJ SOLN
INTRAMUSCULAR | Status: AC
  Filled 2014-03-22: qty 1

## 2014-03-22 MED ORDER — MORPHINE SULFATE 2 MG/ML IJ SOLN: 2.0000 mg | INTRAMUSCULAR | Status: DC | PRN

## 2014-03-22 MED ORDER — SUCCINYLCHOLINE CHLORIDE 20 MG/ML IJ SOLN
INTRAMUSCULAR | Status: DC | PRN
  Administered 2014-03-22: 100 mg via INTRAVENOUS

## 2014-03-22 MED ORDER — ONDANSETRON HCL 4 MG/2ML IJ SOLN
INTRAMUSCULAR | Status: AC
  Filled 2014-03-22: qty 2

## 2014-03-22 MED ORDER — OXYCODONE HCL 5 MG/5ML PO SOLN: 5.0000 mg | ORAL | Status: DC | PRN

## 2014-03-22 MED ORDER — HEPARIN SODIUM (PORCINE) 5000 UNIT/ML IJ SOLN: 5000.0000 [IU] | Freq: Three times a day (TID) | INTRAMUSCULAR | Status: DC

## 2014-03-22 MED ORDER — MIDAZOLAM HCL 2 MG/2ML IJ SOLN
INTRAMUSCULAR | Status: AC
  Filled 2014-03-22: qty 2

## 2014-03-22 MED ORDER — BUPIVACAINE LIPOSOME 1.3 % IJ SUSP
20.0000 mL | Freq: Once | INTRAMUSCULAR | Status: AC
  Administered 2014-03-22: 20 mL
  Filled 2014-03-22: qty 20

## 2014-03-22 MED ORDER — HEPARIN SODIUM (PORCINE) 5000 UNIT/ML IJ SOLN
5000.0000 [IU] | Freq: Three times a day (TID) | INTRAMUSCULAR | Status: DC
  Administered 2014-03-22 – 2014-03-24 (×5): 5000 [IU] via SUBCUTANEOUS
  Filled 2014-03-22 (×8): qty 1

## 2014-03-22 MED ORDER — HYDROMORPHONE HCL PF 1 MG/ML IJ SOLN: 0.2500 mg | INTRAMUSCULAR | Status: DC | PRN

## 2014-03-22 MED ORDER — LIDOCAINE HCL (CARDIAC) 20 MG/ML IV SOLN
INTRAVENOUS | Status: AC
  Filled 2014-03-22: qty 5

## 2014-03-22 MED ORDER — ACETAMINOPHEN 160 MG/5ML PO SOLN: 325.0000 mg | ORAL | Status: DC | PRN

## 2014-03-22 MED ORDER — HEPARIN SODIUM (PORCINE) 5000 UNIT/ML IJ SOLN
5000.0000 [IU] | Freq: Once | INTRAMUSCULAR | Status: AC
  Administered 2014-03-22: 5000 [IU] via SUBCUTANEOUS
  Filled 2014-03-22: qty 1

## 2014-03-22 MED ORDER — LACTATED RINGERS IV SOLN: INTRAVENOUS | Status: DC

## 2014-03-22 MED ORDER — FENTANYL CITRATE 0.05 MG/ML IJ SOLN
INTRAMUSCULAR | Status: AC
  Filled 2014-03-22: qty 5

## 2014-03-22 MED ORDER — NEOSTIGMINE METHYLSULFATE 10 MG/10ML IV SOLN
INTRAVENOUS | Status: DC | PRN
  Administered 2014-03-22: 3 mg via INTRAVENOUS

## 2014-03-22 MED ORDER — CEFAZOLIN SODIUM-DEXTROSE 2-3 GM-% IV SOLR
2.0000 g | INTRAVENOUS | Status: AC
  Administered 2014-03-22: 2 g via INTRAVENOUS

## 2014-03-22 MED ORDER — MORPHINE SULFATE 2 MG/ML IJ SOLN
2.0000 mg | INTRAMUSCULAR | Status: DC | PRN
  Administered 2014-03-22 (×2): 4 mg via INTRAVENOUS
  Administered 2014-03-22: 6 mg via INTRAVENOUS
  Administered 2014-03-22: 2 mg via INTRAVENOUS
  Administered 2014-03-23 – 2014-03-24 (×10): 4 mg via INTRAVENOUS
  Filled 2014-03-22 (×13): qty 2
  Filled 2014-03-22: qty 3

## 2014-03-22 MED ORDER — EPHEDRINE SULFATE 50 MG/ML IJ SOLN
INTRAMUSCULAR | Status: AC
  Filled 2014-03-22: qty 1

## 2014-03-22 MED ORDER — ONDANSETRON HCL 4 MG/2ML IJ SOLN: 4.0000 mg | INTRAMUSCULAR | Status: DC | PRN

## 2014-03-22 MED ORDER — LACTATED RINGERS IR SOLN
Status: DC | PRN
  Administered 2014-03-22: 1000 mL

## 2014-03-22 MED ORDER — DEXAMETHASONE SODIUM PHOSPHATE 10 MG/ML IJ SOLN
INTRAMUSCULAR | Status: DC | PRN
  Administered 2014-03-22: 10 mg via INTRAVENOUS

## 2014-03-22 MED ORDER — UNJURY CHOCOLATE CLASSIC POWDER
2.0000 [oz_av] | Freq: Four times a day (QID) | ORAL | Status: DC
Start: 2014-03-23 — End: 2014-03-24
  Administered 2014-03-23 – 2014-03-24 (×6): 2 [oz_av] via ORAL

## 2014-03-22 MED ORDER — UNJURY CHICKEN SOUP POWDER: 2.0000 [oz_av] | Freq: Four times a day (QID) | ORAL | Status: DC

## 2014-03-22 MED ORDER — KCL IN DEXTROSE-NACL 20-5-0.45 MEQ/L-%-% IV SOLN
INTRAVENOUS | Status: DC
  Administered 2014-03-22 – 2014-03-24 (×5): via INTRAVENOUS
  Filled 2014-03-22 (×8): qty 1000

## 2014-03-22 MED ORDER — ROCURONIUM BROMIDE 100 MG/10ML IV SOLN
INTRAVENOUS | Status: AC
  Filled 2014-03-22: qty 1

## 2014-03-22 MED ORDER — UNJURY VANILLA POWDER: 2.0000 [oz_av] | Freq: Four times a day (QID) | ORAL | Status: DC

## 2014-03-22 MED ORDER — MIDAZOLAM HCL 5 MG/5ML IJ SOLN
INTRAMUSCULAR | Status: DC | PRN
  Administered 2014-03-22: 2 mg via INTRAVENOUS

## 2014-03-22 MED ORDER — DEXAMETHASONE SODIUM PHOSPHATE 10 MG/ML IJ SOLN
INTRAMUSCULAR | Status: AC
  Filled 2014-03-22: qty 1

## 2014-03-22 MED ORDER — PROMETHAZINE HCL 25 MG/ML IJ SOLN
6.2500 mg | INTRAMUSCULAR | Status: DC | PRN
  Administered 2014-03-22: 6.25 mg via INTRAVENOUS

## 2014-03-22 MED ORDER — KETOROLAC TROMETHAMINE 30 MG/ML IJ SOLN: 15.0000 mg | Freq: Once | INTRAMUSCULAR | Status: DC | PRN

## 2014-03-22 MED ORDER — ONDANSETRON HCL 4 MG/2ML IJ SOLN
INTRAMUSCULAR | Status: DC | PRN
  Administered 2014-03-22: 4 mg via INTRAVENOUS

## 2014-03-22 MED ORDER — EPHEDRINE SULFATE 50 MG/ML IJ SOLN
INTRAMUSCULAR | Status: DC | PRN
  Administered 2014-03-22: 5 mg via INTRAVENOUS
  Administered 2014-03-22: 10 mg via INTRAVENOUS

## 2014-03-22 MED ORDER — FENTANYL CITRATE 0.05 MG/ML IJ SOLN
INTRAMUSCULAR | Status: DC | PRN
  Administered 2014-03-22: 50 ug via INTRAVENOUS
  Administered 2014-03-22: 25 ug via INTRAVENOUS
  Administered 2014-03-22: 100 ug via INTRAVENOUS
  Administered 2014-03-22: 25 ug via INTRAVENOUS
  Administered 2014-03-22: 50 ug via INTRAVENOUS

## 2014-03-22 MED ORDER — ACETAMINOPHEN 160 MG/5ML PO SOLN
650.0000 mg | ORAL | Status: DC | PRN
  Administered 2014-03-23 – 2014-03-24 (×2): 650 mg via ORAL
  Filled 2014-03-22 (×2): qty 20.3

## 2014-03-22 MED ORDER — GLYCOPYRROLATE 0.2 MG/ML IJ SOLN
INTRAMUSCULAR | Status: DC | PRN
  Administered 2014-03-22: 0.4 mg via INTRAVENOUS

## 2014-03-22 MED ORDER — PROPOFOL 10 MG/ML IV BOLUS
INTRAVENOUS | Status: AC
  Filled 2014-03-22: qty 20

## 2014-03-22 MED ORDER — LACTATED RINGERS IV SOLN
INTRAVENOUS | Status: DC | PRN
  Administered 2014-03-22 (×2): via INTRAVENOUS

## 2014-03-22 MED ORDER — LIDOCAINE HCL (PF) 2 % IJ SOLN
INTRAMUSCULAR | Status: DC | PRN
  Administered 2014-03-22: 30 mg via INTRADERMAL

## 2014-03-22 MED ORDER — CHLORHEXIDINE GLUCONATE 4 % EX LIQD: 1.0000 "application " | Freq: Once | CUTANEOUS | Status: DC

## 2014-03-22 MED ORDER — PROPOFOL 10 MG/ML IV BOLUS
INTRAVENOUS | Status: DC | PRN
  Administered 2014-03-22: 150 mg via INTRAVENOUS

## 2014-03-22 MED ORDER — OXYCODONE HCL 5 MG/5ML PO SOLN
5.0000 mg | ORAL | Status: DC | PRN
  Administered 2014-03-23 – 2014-03-24 (×4): 10 mg via ORAL
  Filled 2014-03-22: qty 50
  Filled 2014-03-22 (×3): qty 10

## 2014-03-22 MED ORDER — SODIUM CHLORIDE 0.9 % IJ SOLN
INTRAMUSCULAR | Status: AC
  Filled 2014-03-22: qty 10

## 2014-03-22 MED ORDER — ROCURONIUM BROMIDE 100 MG/10ML IV SOLN
INTRAVENOUS | Status: DC | PRN
  Administered 2014-03-22: 10 mg via INTRAVENOUS
  Administered 2014-03-22: 30 mg via INTRAVENOUS
  Administered 2014-03-22: 10 mg via INTRAVENOUS

## 2014-03-22 MED ORDER — ONDANSETRON HCL 4 MG/2ML IJ SOLN
4.0000 mg | INTRAMUSCULAR | Status: DC | PRN
  Administered 2014-03-22 – 2014-03-23 (×5): 4 mg via INTRAVENOUS
  Filled 2014-03-22 (×5): qty 2

## 2014-03-22 MED ORDER — CEFAZOLIN SODIUM-DEXTROSE 2-3 GM-% IV SOLR
INTRAVENOUS | Status: AC
  Filled 2014-03-22: qty 50

## 2014-03-22 SURGICAL SUPPLY — 65 items
BENZOIN TINCTURE PRP APPL 2/3 (GAUZE/BANDAGES/DRESSINGS) IMPLANT
BLADE HEX COATED 2.75 (ELECTRODE) ×4 IMPLANT
BLADE SURG 15 STRL LF DISP TIS (BLADE) ×2 IMPLANT
BLADE SURG 15 STRL SS (BLADE) ×2
CANISTER SUCTION 2500CC (MISCELLANEOUS) ×4 IMPLANT
CLOSURE WOUND 1/2 X4 (GAUZE/BANDAGES/DRESSINGS)
COVER SURGICAL LIGHT HANDLE (MISCELLANEOUS) IMPLANT
DECANTER SPIKE VIAL GLASS SM (MISCELLANEOUS) ×8 IMPLANT
DEVICE SUT QUICK LOAD TK 5 (STAPLE) ×12 IMPLANT
DEVICE SUT TI-KNOT TK 5X26 (MISCELLANEOUS) ×3 IMPLANT
DEVICE SUTURE ENDOST 10MM (ENDOMECHANICALS) ×4 IMPLANT
DEVICE TI KNOT TK5 (MISCELLANEOUS) ×1
DISSECTOR BLUNT TIP ENDO 5MM (MISCELLANEOUS) IMPLANT
DRAIN CHANNEL 19F RND (DRAIN) ×4 IMPLANT
DRAPE CAMERA CLOSED 9X96 (DRAPES) ×4 IMPLANT
ELECT REM PT RETURN 9FT ADLT (ELECTROSURGICAL) ×4
ELECTRODE REM PT RTRN 9FT ADLT (ELECTROSURGICAL) ×2 IMPLANT
EVACUATOR 1/8 PVC DRAIN (DRAIN) ×4 IMPLANT
GAUZE SPONGE 4X4 12PLY STRL (GAUZE/BANDAGES/DRESSINGS) ×4 IMPLANT
GLOVE BIOGEL M 8.0 STRL (GLOVE) ×8 IMPLANT
GLOVE BIOGEL PI IND STRL 7.0 (GLOVE) ×12 IMPLANT
GLOVE BIOGEL PI INDICATOR 7.0 (GLOVE) ×12
GOWN SPEC L4 XLG W/TWL (GOWN DISPOSABLE) ×4 IMPLANT
GOWN STRL REUS W/TWL LRG LVL3 (GOWN DISPOSABLE) ×4 IMPLANT
GOWN STRL REUS W/TWL XL LVL3 (GOWN DISPOSABLE) ×20 IMPLANT
HOVERMATT SINGLE USE (MISCELLANEOUS) IMPLANT
KIT BASIN OR (CUSTOM PROCEDURE TRAY) ×4 IMPLANT
NEEDLE ACCESS PORT 2 (NEEDLE) IMPLANT
NEEDLE SPNL 22GX3.5 QUINCKE BK (NEEDLE) ×4 IMPLANT
NS IRRIG 1000ML POUR BTL (IV SOLUTION) ×4 IMPLANT
PACK UNIVERSAL I (CUSTOM PROCEDURE TRAY) ×4 IMPLANT
PENCIL BUTTON HOLSTER BLD 10FT (ELECTRODE) ×4 IMPLANT
QUICK LOAD TK 5 (STAPLE) ×4
SET BERKELEY SUCTION TUBING (SUCTIONS) ×8 IMPLANT
SET IRRIG TUBING LAPAROSCOPIC (IRRIGATION / IRRIGATOR) ×4 IMPLANT
SHEARS CURVED HARMONIC AC 45CM (MISCELLANEOUS) IMPLANT
SHEARS HARMONIC ACE PLUS 36CM (ENDOMECHANICALS) IMPLANT
SLEEVE ADV FIXATION 5X100MM (TROCAR) IMPLANT
SLEEVE Z-THREAD 5X100MM (TROCAR) IMPLANT
SOLUTION ANTI FOG 6CC (MISCELLANEOUS) ×4 IMPLANT
SPONGE LAP 18X18 X RAY DECT (DISPOSABLE) ×4 IMPLANT
STAPLER VISISTAT 35W (STAPLE) ×4 IMPLANT
STRIP CLOSURE SKIN 1/2X4 (GAUZE/BANDAGES/DRESSINGS) IMPLANT
SUT ETHIBOND 2 0 SH (SUTURE)
SUT ETHIBOND 2 0 SH 36X2 (SUTURE) IMPLANT
SUT ETHILON 2 0 PS N (SUTURE) ×4 IMPLANT
SUT PROLENE 2 0 CT2 30 (SUTURE) IMPLANT
SUT SILK 0 (SUTURE)
SUT SILK 0 30XBRD TIE 6 (SUTURE) IMPLANT
SUT SURGIDAC NAB ES-9 0 48 120 (SUTURE) ×16 IMPLANT
SUT VIC AB 2-0 SH 27 (SUTURE)
SUT VIC AB 2-0 SH 27X BRD (SUTURE) IMPLANT
SUT VIC AB 4-0 SH 18 (SUTURE) ×4 IMPLANT
SYR 30ML LL (SYRINGE) ×4 IMPLANT
SYRINGE 20CC LL (MISCELLANEOUS) ×4 IMPLANT
TOWEL OR 17X26 10 PK STRL BLUE (TOWEL DISPOSABLE) ×8 IMPLANT
TROCAR ADV FIXATION 11X100MM (TROCAR) IMPLANT
TROCAR BLADELESS OPT 5 100 (ENDOMECHANICALS) ×4 IMPLANT
TROCAR XCEL NON-BLD 11X100MML (ENDOMECHANICALS) ×4 IMPLANT
TROCAR XCEL UNIV SLVE 11M 100M (ENDOMECHANICALS) IMPLANT
TUBE CALIBRATION LAPBAND (TUBING) IMPLANT
TUBING ENDO SMARTCAP (MISCELLANEOUS) ×4 IMPLANT
TUBING INSUFFLATION 10FT LAP (TUBING) ×4 IMPLANT
WATER STERILE IRR 1500ML POUR (IV SOLUTION) ×4 IMPLANT
WATER STERILE IRR 500ML POUR (IV SOLUTION) ×4 IMPLANT

## 2014-03-22 NOTE — H&P (View-Only) (Signed)
Chief Complaint:  Anterior lapband slip  History of Present Illness:  Kimberly Simpson is an 38 y.o. female with intermittent dysphagia to solids and liquids. An upper GI shows effacement of her lap band. This is all consistent with an anterior slip. I discussed laparoscopic LAP-BAND revision including the possibility that we might have to remove her band.   Past Medical History  Diagnosis Date  . IBS (irritable bowel syndrome)   . Back pain   . Depression   . GERD (gastroesophageal reflux disease)   . Migraines     Past Surgical History  Procedure Laterality Date  . Spine surgery    . Tonsillectomy    . Back surgery      disc rupture and fusion  . Intrauterine device insertion    . Laparoscopic gastric banding  12/30/2007  . Tympanostomy tube placement      Current Outpatient Prescriptions  Medication Sig Dispense Refill  . DULoxetine (CYMBALTA) 30 MG capsule Take 30 mg by mouth daily.      . eletriptan (RELPAX) 40 MG tablet One tablet by mouth as needed for migraine headache.  If the headache improves and then returns, dose may be repeated after 2 hours have elapsed since first dose (do not exceed 80 mg per day). may repeat in 2 hours if necessary       . esomeprazole (NEXIUM) 40 MG capsule Take 1 capsule (40 mg total) by mouth daily at 12 noon.  30 capsule  1  . magnesium 30 MG tablet Take 30 mg by mouth 2 (two) times daily.      . Multiple Vitamins-Minerals (MULTIVITAMIN WITH MINERALS) tablet Take 1 tablet by mouth daily.       No current facility-administered medications for this visit.   Ciprocin-fluocin-procin Family History  Problem Relation Age of Onset  . Hyperlipidemia Mother   . Hypertension Mother   . Hyperlipidemia Father    Social History:   reports that she has quit smoking. She does not have any smokeless tobacco history on file. She reports that she drinks alcohol. She reports that she does not use illicit drugs.   REVIEW OF SYSTEMS : Positive for  intermittent dysphagia to solids and liquids ; otherwise negative  Physical Exam:   Blood pressure 98/68, pulse 64, temperature 97.4 F (36.3 C), temperature source Temporal, resp. rate 14, height 5' 4" (1.626 m), weight 197 lb 3.2 oz (89.449 kg). Body mass index is 33.83 kg/(m^2).  Gen:  WDWN WF NAD  Neurological: Alert and oriented to person, place, and time. Motor and sensory function is grossly intact  Head: Normocephalic and atraumatic.  Eyes: Conjunctivae are normal. Pupils are equal, round, and reactive to light. No scleral icterus.  Neck: Normal range of motion. Neck supple. No tracheal deviation or thyromegaly present.  Cardiovascular:  SR without murmurs or gallops.  No carotid bruits Breast:  Not examined Respiratory: Effort normal.  No respiratory distress. No chest wall tenderness. Breath sounds normal.  No wheezes, rales or rhonchi.  Abdomen:  nontender GU: not examined Musculoskeletal: Normal range of motion. Extremities are nontender. No cyanosis, edema or clubbing noted Lymphadenopathy: No cervical, preauricular, postauricular or axillary adenopathy is present Skin: Skin is warm and dry. No rash noted. No diaphoresis. No erythema. No pallor. Pscyh: Normal mood and affect. Behavior is normal. Judgment and thought content normal.   LABORATORY RESULTS: No results found for this or any previous visit (from the past 48 hour(s)).   RADIOLOGY RESULTS: No results   found.  Problem List: Patient Active Problem List   Diagnosis Date Noted  . Left ankle sprain 12/15/2013  . Injury of left shoulder 12/15/2013  . [facility] 11/02/2012    Assessment & Plan: Anterior slip of APS lapband.  Plan revision of lapband.     Matt B. Daphine DeutscherMartin, MD, [facility] [place]  [facility] [phone] beeper [phone]  03/21/2014 2:21 PM

## 2014-03-22 NOTE — Interval H&P Note (Signed)
History and Physical Interval Note:  03/22/2014 7:28 AM  Kimberly Simpson  has presented today for surgery, with the diagnosis of prolapsed lap band  The various methods of treatment have been discussed with the patient and family. After consideration of risks, benefits and other options for treatment, the patient has consented to  Procedure(s): LAPAROSCOPIC REVISION OF GASTRIC BAND (N/A) as a surgical intervention .  The patient's history has been reviewed, patient examined, no change in status, stable for surgery.  I have reviewed the patient's chart and labs.  Questions were answered to the patient's satisfaction.     MARTIN,MATTHEW B

## 2014-03-22 NOTE — Transfer of Care (Signed)
Immediate Anesthesia Transfer of Care Note  Patient: Kimberly Simpson  Procedure(s) Performed: Procedure(s) (LRB): LAPAROSCOPIC REVISION OF GASTRIC BAND (N/A) UPPER GI ENDOSCOPY  Patient Location: PACU  Anesthesia Type: General  Level of Consciousness: sedated, patient cooperative and responds to stimulation  Airway & Oxygen Therapy: Patient Spontanous Breathing and Patient connected to face mask oxgen  Post-op Assessment: Report given to PACU RN and Post -op Vital signs reviewed and stable  Post vital signs: Reviewed and stable  Complications: No apparent anesthesia complications

## 2014-03-22 NOTE — Anesthesia Postprocedure Evaluation (Signed)
  Anesthesia Post-op Note  Patient: Kimberly Simpson  Procedure(s) Performed: Procedure(s) (LRB): LAPAROSCOPIC REVISION OF GASTRIC BAND (N/A) UPPER GI ENDOSCOPY  Patient Location: PACU  Anesthesia Type: General  Level of Consciousness: awake and alert   Airway and Oxygen Therapy: Patient Spontanous Breathing  Post-op Pain: mild  Post-op Assessment: Post-op Vital signs reviewed, Patient's Cardiovascular Status Stable, Respiratory Function Stable, Patent Airway and No signs of Nausea or vomiting  Last Vitals:  Filed Vitals:   03/22/14 1100  BP: 115/67  Pulse:   Temp:   Resp:     Post-op Vital Signs: stable   Complications: No apparent anesthesia complications

## 2014-03-22 NOTE — Progress Notes (Signed)
Pt c/o severe left shoulder pain.  Dr. Daphine DeutscherMartin notified-gave instructions to give IV pain med and not po Roxicodone at this time;  Continue w/ sipping 2oz water/ice slowly q4h.  -

## 2014-03-22 NOTE — Op Note (Signed)
Surgeon: Wenda LowMatt Martin, MD, FACS  Asst:  none  Anes:  Gen.  Procedure: Laparoscopic takedown of lap band, reduction of anterior slip, upper endoscopy, re\re plication of lap band  Diagnosis: Anterior slip of lap band APS  Complications: none  EBL:   minimal cc  Drains: 19 Blake drain in the subdiaphragmatic region.  Description of Procedure:  [facility]  After anesthesia was administered and the patient was prepped a timeout was performed.  Access to the abdomen was achieved through the left upper quadrant using a 5 mm Optiview technique without difficulty. Abdomen was insufflated and a second 5 mm was placed to the left of the umbilicus at a site of a previous camera. Below the port site on the right I initially put a 5 mm which subsequently was upgraded to a 12 later in the case. 5 mm was placed laterally and a 5 mm the upper midline was used for placement of the Surveyor, miningathanson retractor.  Dissection began with sharp scissor dissection were taken down the anterior band from the liver. This was done and carried cephalad. I then freed the band and buckle from adhesions. I then took down the large portion of the anterior plication before on buckling it and using scissors to take this down further out laterally. I then went beneath the band and cut this cicatrix from below to open that up as well.  A seemed to be reducing the anterior slip this point and I went ahead and went to the head of the table and performed an upper endoscopy using the Olympus endoscope. This was inserted in the mouth and passed without difficulty to the EG junction. I then went across this inflating looking for evidence of bubbles and none were seen. Went into the stomach and retroflexed to see the band in good position. There is no evidence of erosion. I then pressed the stomach and pulled endoscope back across the area that previously been banded and the decompressed the distal  esophagus.  I then re\re scrubbed and gowned and when in and performed a redo plication. The band at this point was re\re snapped and buckled. It was held down morbidly and I then went over and grasped stomach laterally and plicated this with 4 sutures using the Endo Stitch and Ty knots. The band appeared to be in good position. I placed a 19 Blake drain through the lateral portal and left up beneath the diaphragm near the band. Port sites were injected with Exparel. The abdomen was deflated ports withdrawn and closed 4-0 Vicryl and Dermabond.  The patient tolerated the procedure well and was taken to the PACU in stable condition.     Matt B. Daphine DeutscherMartin, MD, [facility] [place] [phone]

## 2014-03-22 NOTE — Anesthesia Preprocedure Evaluation (Signed)
Anesthesia Evaluation  Patient identified by MRN, date of birth, ID band Patient awake    Reviewed: Allergy & Precautions, H&P , NPO status , Patient's Chart, lab work & pertinent test results  Airway Mallampati: II  TM Distance: >3 FB Neck ROM: Full    Dental no notable dental hx.    Pulmonary neg pulmonary ROS, former smoker,  breath sounds clear to auscultation  Pulmonary exam normal       Cardiovascular negative cardio ROS  Rhythm:Regular Rate:Normal     Neuro/Psych negative neurological ROS  negative psych ROS   GI/Hepatic Neg liver ROS, GERD-  Medicated,  Endo/Other  negative endocrine ROS  Renal/GU negative Renal ROS  negative genitourinary   Musculoskeletal negative musculoskeletal ROS (+)   Abdominal   Peds negative pediatric ROS (+)  Hematology negative hematology ROS (+)   Anesthesia Other Findings   Reproductive/Obstetrics negative OB ROS                             Anesthesia Physical Anesthesia Plan  ASA: II  Anesthesia Plan: General   Post-op Pain Management:    Induction: Intravenous  Airway Management Planned: Oral ETT  Additional Equipment:   Intra-op Plan:   Post-operative Plan: Extubation in OR  Informed Consent: I have reviewed the patients History and Physical, chart, labs and discussed the procedure including the risks, benefits and alternatives for the proposed anesthesia with the patient or authorized representative who has indicated his/her understanding and acceptance.   Dental advisory given  Plan Discussed with: CRNA and Surgeon  Anesthesia Plan Comments:         Anesthesia Quick Evaluation  

## 2014-03-23 ENCOUNTER — Observation Stay (HOSPITAL_COMMUNITY): Payer: BC Managed Care – PPO

## 2014-03-23 ENCOUNTER — Encounter (HOSPITAL_COMMUNITY): Payer: Self-pay | Admitting: Surgery

## 2014-03-23 DIAGNOSIS — K589 Irritable bowel syndrome without diarrhea: Secondary | ICD-10-CM | POA: Diagnosis present

## 2014-03-23 DIAGNOSIS — F3289 Other specified depressive episodes: Secondary | ICD-10-CM | POA: Diagnosis present

## 2014-03-23 DIAGNOSIS — Z9884 Bariatric surgery status: Secondary | ICD-10-CM | POA: Diagnosis not present

## 2014-03-23 DIAGNOSIS — Z79899 Other long term (current) drug therapy: Secondary | ICD-10-CM | POA: Diagnosis not present

## 2014-03-23 DIAGNOSIS — K219 Gastro-esophageal reflux disease without esophagitis: Secondary | ICD-10-CM | POA: Diagnosis present

## 2014-03-23 DIAGNOSIS — R1310 Dysphagia, unspecified: Secondary | ICD-10-CM | POA: Diagnosis present

## 2014-03-23 DIAGNOSIS — K9509 Other complications of gastric band procedure: Secondary | ICD-10-CM | POA: Diagnosis present

## 2014-03-23 DIAGNOSIS — Y831 Surgical operation with implant of artificial internal device as the cause of abnormal reaction of the patient, or of later complication, without mention of misadventure at the time of the procedure: Secondary | ICD-10-CM | POA: Diagnosis present

## 2014-03-23 DIAGNOSIS — Z87891 Personal history of nicotine dependence: Secondary | ICD-10-CM | POA: Diagnosis not present

## 2014-03-23 DIAGNOSIS — Z4651 Encounter for fitting and adjustment of gastric lap band: Secondary | ICD-10-CM

## 2014-03-23 DIAGNOSIS — F329 Major depressive disorder, single episode, unspecified: Secondary | ICD-10-CM | POA: Diagnosis present

## 2014-03-23 DIAGNOSIS — M25519 Pain in unspecified shoulder: Secondary | ICD-10-CM | POA: Diagnosis not present

## 2014-03-23 DIAGNOSIS — Z8249 Family history of ischemic heart disease and other diseases of the circulatory system: Secondary | ICD-10-CM | POA: Diagnosis not present

## 2014-03-23 LAB — CBC WITH DIFFERENTIAL/PLATELET
BASOS ABS: 0 10*3/uL (ref 0.0–0.1)
Basophils Relative: 0 % (ref 0–1)
Eosinophils Absolute: 0.1 10*3/uL (ref 0.0–0.7)
Eosinophils Relative: 1 % (ref 0–5)
HCT: 39.4 % (ref 36.0–46.0)
Hemoglobin: 12.9 g/dL (ref 12.0–15.0)
LYMPHS ABS: 2.1 10*3/uL (ref 0.7–4.0)
Lymphocytes Relative: 18 % (ref 12–46)
MCH: 29.1 pg (ref 26.0–34.0)
MCHC: 32.7 g/dL (ref 30.0–36.0)
MCV: 88.9 fL (ref 78.0–100.0)
Monocytes Absolute: 1.1 10*3/uL — ABNORMAL HIGH (ref 0.1–1.0)
Monocytes Relative: 10 % (ref 3–12)
NEUTROS ABS: 8.4 10*3/uL — AB (ref 1.7–7.7)
Neutrophils Relative %: 71 % (ref 43–77)
Platelets: 279 10*3/uL (ref 150–400)
RBC: 4.43 MIL/uL (ref 3.87–5.11)
RDW: 13.9 % (ref 11.5–15.5)
WBC: 11.8 10*3/uL — AB (ref 4.0–10.5)

## 2014-03-23 MED ORDER — ELETRIPTAN HYDROBROMIDE 40 MG PO TABS
40.0000 mg | ORAL_TABLET | Freq: Once | ORAL | Status: AC
  Administered 2014-03-23: 40 mg via ORAL
  Filled 2014-03-23: qty 1

## 2014-03-23 MED ORDER — IOHEXOL 300 MG/ML  SOLN
50.0000 mL | Freq: Once | INTRAMUSCULAR | Status: AC | PRN
  Administered 2014-03-23: 20 mL via ORAL

## 2014-03-23 NOTE — Progress Notes (Signed)
Utilization review completed.  

## 2014-03-23 NOTE — Progress Notes (Signed)
Spoke with Dr Andrey CampanileWilson regarding patient complaints of migraine headache.  Discussed patient takes Relpax at home for migraines.  Dr Andrey CampanileWilson ordered medication and stated she could take the capsule as she is a revision of a gastric band.

## 2014-03-23 NOTE — Progress Notes (Signed)
Patient alert and oriented, Post op day 1.  Provided support and encouragement.  Encouraged pulmonary toilet, ambulation and small sips of liquids.  All questions answered.  Will continue to monitor. 

## 2014-03-24 LAB — CBC WITH DIFFERENTIAL/PLATELET
Basophils Absolute: 0 10*3/uL (ref 0.0–0.1)
Basophils Relative: 1 % (ref 0–1)
EOS PCT: 5 % (ref 0–5)
Eosinophils Absolute: 0.3 10*3/uL (ref 0.0–0.7)
HEMATOCRIT: 34.9 % — AB (ref 36.0–46.0)
Hemoglobin: 11.3 g/dL — ABNORMAL LOW (ref 12.0–15.0)
LYMPHS ABS: 1.8 10*3/uL (ref 0.7–4.0)
Lymphocytes Relative: 27 % (ref 12–46)
MCH: 28.9 pg (ref 26.0–34.0)
MCHC: 32.4 g/dL (ref 30.0–36.0)
MCV: 89.3 fL (ref 78.0–100.0)
MONO ABS: 0.9 10*3/uL (ref 0.1–1.0)
Monocytes Relative: 13 % — ABNORMAL HIGH (ref 3–12)
NEUTROS ABS: 3.5 10*3/uL (ref 1.7–7.7)
Neutrophils Relative %: 54 % (ref 43–77)
Platelets: 217 10*3/uL (ref 150–400)
RBC: 3.91 MIL/uL (ref 3.87–5.11)
RDW: 13.9 % (ref 11.5–15.5)
WBC: 6.5 10*3/uL (ref 4.0–10.5)

## 2014-03-24 MED ORDER — GI COCKTAIL ~~LOC~~
30.0000 mL | Freq: Once | ORAL | Status: AC
  Administered 2014-03-24: 30 mL via ORAL
  Filled 2014-03-24: qty 30

## 2014-03-24 MED ORDER — KETOROLAC TROMETHAMINE 30 MG/ML IJ SOLN
30.0000 mg | Freq: Three times a day (TID) | INTRAMUSCULAR | Status: DC | PRN
Start: 2014-03-24 — End: 2014-03-24
  Administered 2014-03-24 (×2): 30 mg via INTRAVENOUS
  Filled 2014-03-24: qty 1
  Filled 2014-03-24: qty 2

## 2014-03-24 MED ORDER — HYDROCODONE-ACETAMINOPHEN 7.5-325 MG/15ML PO SOLN: 15.0000 mL | ORAL | Status: DC | PRN

## 2014-03-24 MED ORDER — PANTOPRAZOLE SODIUM 40 MG IV SOLR
40.0000 mg | Freq: Every day | INTRAVENOUS | Status: DC
  Administered 2014-03-24: 40 mg via INTRAVENOUS
  Filled 2014-03-24 (×2): qty 40

## 2014-03-24 NOTE — Discharge Instructions (Signed)
° °                ° °ADJUSTABLE GASTRIC BAND ° Home Care Instructions ° ° These instructions are to help you care for yourself when you go home. ° °Call: If you have any problems. °• Call [phone] and ask for the surgeon on call °• If you need immediate assistance come to the ER at [facility]. Tell the ER staff you are a new post-op gastric banding patient  °Signs and symptoms to report: • Severe  vomiting or nausea °o If you cannot handle clear liquids for longer than 1 day, call your surgeon °• Abdominal pain which does not get better after taking your pain medication °• Fever greater than 100.4°  F and chills °• Heart rate over 100 beats a minute °• Trouble breathing °• Chest pain °• Redness,  swelling, drainage, or foul odor at incision (surgical) sites °• If your incisions open or pull apart °• Swelling or pain in calf (lower leg) °• Diarrhea (Loose bowel movements that happen often), frequent watery, uncontrolled bowel movements °• Constipation, (no bowel movements for 3 days) if this happens: °o Take Milk of Magnesia, 2 tablespoons by mouth, 3 times a day for 2 days if needed °o Stop taking Milk of Magnesia once you have had a bowel movement °o Call your doctor if constipation continues °Or °o Take Miralax  (instead of Milk of Magnesia) following the label instructions °o Stop taking Miralax once you have had a bowel movement °o Call your doctor if constipation continues °• Anything you think is “abnormal for you” °  °Normal side effects after surgery: • Unable to sleep at night or unable to concentrate °• Irritability °• Being tearful (crying) or depressed ° °These are common complaints, possibly related to your anesthesia, stress of surgery, and change in lifestyle, that usually go away a few weeks after surgery. If these feelings continue, call your medical doctor.  °Wound Care: You may have surgical glue, steri-strips, or staples over your incisions after surgery °• Surgical glue: Looks like clear  film over your incisions and will wear off a little at a time °• Steri-strips: Adhesive strips of tape over your incisions. You may notice a yellowish color on skin under the steri-strips. This is used to make the steri-strips stick better. Do not pull the steri-strips off - let them fall off °• Staples: Staples may be removed before you leave the hospital °o If you go home with staples, call [facility] for an appointment with your surgeon’s nurse to have staples removed 10 days after surgery, [phone] °• Showering: You may shower two (2) days after your surgery unless your surgeon tells you differently °o Wash gently around incisions with warm soapy water, rinse well, and gently pat dry °o If you have a drain (tube from your incision), you may need someone to hold this while you shower °o No tub baths until staples are removed and incisions are healed °  °Medications: • Medications should be liquid or crushed if larger than the size of a dime °• Extended release pills (medication that releases a little bit at a time through the  day) should not be crushed °• Depending on the size and number of medications you take, you may need to space (take a few throughout the day)/change the time you take your medications so that you do not over-fill your pouch (smaller stomach) °• Make sure you follow-up with you primary care physician   to make medication changes needed during rapid weight loss and life -style changes °• If you have diabetes, follow up with your doctor that orders your diabetes medication(s) within one week after surgery and check your blood sugar regularly ° °• Do not drive while taking narcotics (pain medications) ° °• Do not take acetaminophen (Tylenol) and Roxicet or Lortab Elixir at the same time since these pain medications contain acetaminophen °  °Diet:  °First 2 Weeks You will see the nutritionist about two (2) weeks after your surgery. The nutritionist will increase the types of  foods you can eat if you are handling liquids well: °• If you have severe vomiting or nausea and cannot handle clear liquids lasting longer than 1 day call your surgeon °For Same Day Surgery Discharge Patients: °• The day of surgery drink water only: 2 ounces every 4 hours °• If you are handling water, start drinking your high protein shake the next morning °For Overnight Stay Patients: °• Begin by drinking 2 ounces of a high protein every 3 hours, 5-6 times per day °• Slowly increase the amount you drink as tolerated °• You may find it easier to slowly sip shakes throughout the day. It is important to get your proteins in first °  ° Protein Shake °• Drink at least 2 ounces of shake 5-6 times per day °• Each serving of protein shakes (usually 8-12 ounces) should have a minimum of: °o 15 grams of protein °o And no more than 5 grams of carbohydrate °• Goal for protein each day: °o Men = 80 grams per day °o Women = 60 grams per day °• Protein powder may be added to fluids such as non-fat milk or Lactaid milk or Soy milk (limit to 35 grams added protein powder per serving) ° °Hydration °• Slowly increase the amount of water and other clear liquids as tolerated (See Acceptable Fluids) °• Slowly increase the amount of protein shake as tolerated °• Sip fluids slowly and throughout the day °• May use sugar substitutes in small amounts (no more than 6-8 packets per day; i.e. Splenda) ° °Fluid Goal °• The first goal is to drink at least 8 ounces of protein shake/drink per day (or as directed by the nutritionist); some examples of protein shakes are Syntrax, Nectar, Adkins Advantage, EAS Edge HP, and Unjury. - See handout from pre-op Bariatric Education Class: °o Slowly increase the amount of protein shake you drink as tolerated °o You may find it easier to slowly sip shakes throughout the day °o It is important to get your proteins in first °• Your fluid goal is to drink 64-100 ounces of fluid daily °o It may take a few weeks  to build up to this  °• 32 oz. (or more) should be full liquids (see below for examples) °• Liquids should not contain sugar, caffeine, or carbonation ° °Clear Liquids: °• Water of Sugar-free flavored water (i.e. Fruit H²O, Propel) °• Decaffeinated coffee or tea (sugar-free) °• Crystal lite, Wyler’s Lite, Minute Maid Lite °• Sugar-free Jell-O °• Bouillon or broth °• Sugar-free Popsicle:    - Less than 20 calories each; Limit 1 per day ° ° ° ° °  ° Full Liquids: °                  Protein Shakes/Drinks + 2 choices per day of other full liquids °• Full liquids must be: °o No More Than 12 grams of Carbs per serving °o No More Than 3 grams   of Fat per serving °• Strained low-fat cream soup °• Non-Fat milk °• Fat-free Lactaid Milk °• Sugar-free yogurt (Dannon Lite & Fit, Greek yogurt) °  °Vitamins and Minerals • Start 1 day after surgery unless otherwise directed by your surgeon °• 1 Chewable Multivitamin / Multimineral Supplement with iron (i.e. Centrum for Adults) °• Chewable Calcium Citrate with Vitamin D-3 °(Example: 3 Chewable Calcium  Plus 600 with vitamin D-3) °o Take 500 mg three (3) times a day for a total of 1500 mg per day °o Do not take all 3 doses of calcium at one time as it may cause constipation, and you can only absorb 500 mg at a time °o Do not mix multivitamins containing iron with calcium supplements;  take 2 hours apart °o Do not substitute Tums (calcium carbonate) for your calcium °• Menstruating women and those at risk for anemia ( a blood disease that causes weakness) may need extra iron °o Talk to your doctor to see if you need more iron °• If you need extra iron: total daily iron recommendation (including Vitamins) is 50 to 100 mg Iron/day °• Do not stop taking or change any vitamins or minerals until you talk to your nutritionist or surgeon °• Your nutritionist and/or surgeon must approve all vitamin and mineral supplements  °Activity and Exercise: It is important to continue walking at home.  Limit your physical activity as instructed by your doctor. During this time, use these guidelines: °• Do not lift anything greater than ten  (10) pounds for at least two (2) weeks °• Do not go back to work or drive until your surgeon says you can °• You may have sex when you feel comfortable °o It is VERY important for female patients to use a reliable birth control method; fertility often increase after surgery °o Do not get pregnant for at least 18 months °• Start exercising as soon as your doctor tells you that you can °o Make sure your doctor approves any physical activity °• Start with a simple walking program °• Walk 5-15 minutes each day, 7 days per week °• Slowly increase until you are walking 30-45 minutes per day °• Consider joining our BELT program. [phone] or email [email or web address] ° °  °Special Instructions  Things to remember: °• Free counseling is available for you and your family through collaboration between [facility] and [employer]. Please call [phone] and leave a message °• Use your CPAP when sleeping if this applies to you °• Consider buying a medical alert bracelet that says you had lap-band surgery °• You will likely have your first fill (fluid added to your band) 6 - 8 weeks after surgery °• [facility] has a free Bariatric Surgery Support Group that meets monthly, the 3rd Thursday, 6pm. [facility] Classrooms. You can see classes online at [email or web address] °• It is very important to keep all follow up appointments with your surgeon, nutritionist, primary care physician, and behavioral health practitioner °o After the first year, please follow up with your bariatric surgeon and nutritionist at least once a year in order to maintain best weight loss results °      °             [facility]:  [phone] ° °             [facility]: [phone] ° °             Bariatric Nurse Coordinator:   [phone] °  °   Adjustable Gastric Band Home Care Instructions  Rev. 10/2012                                                            ° °     Reviewed and Endorsed °                                                  by [facility] Patient Education Committee, Jan, 2014 ° °

## 2014-03-24 NOTE — Progress Notes (Signed)
Patient migraine pain decreased with home med.  Pt continues to rate this left shoulder pain as a 8-10.  She states that it is not being relieved like before.  Patient receiving IV morphine and Roxicodone. Heat and cold therapy also utilized.  Spoke with Dr Andrey CampanileWilson regarding patient continued complaints of shoulder pain.  Dr Andrey CampanileWilson to place new orders

## 2014-03-24 NOTE — Progress Notes (Signed)
Nutrition Education Note  Received consult for diet education per DROP protocol.   Discussed 2 week post op diet with pt. Emphasized that liquids must be non carbonated, non caffeinated, and sugar free. Fluid goals discussed. Pt to follow up with outpatient bariatric RD for further diet progression after 2 weeks. Multivitamins and minerals also reviewed. Teach back method used, pt expressed understanding, expect good compliance.   Diet: First 2 Weeks  You will see the nutritionist about two (2) weeks after your surgery. The nutritionist will increase the types of foods you can eat if you are handling liquids well:  If you have severe vomiting or nausea and cannot handle clear liquids lasting longer than 1 day, call your surgeon  Protein Shake  Drink at least 2 ounces of shake 5-6 times per day  Each serving of protein shakes (usually 8 - 12 ounces) should have a minimum of:  15 grams of protein  And no more than 5 grams of carbohydrate  Goal for protein each day:  Men = 80 grams per day  Women = 60 grams per day  Protein powder may be added to fluids such as non-fat milk or Lactaid milk or Soy milk (limit to 35 grams added protein powder per serving)   Hydration  Slowly increase the amount of water and other clear liquids as tolerated (See Acceptable Fluids)  Slowly increase the amount of protein shake as tolerated  Sip fluids slowly and throughout the day  May use sugar substitutes in small amounts (no more than 6 - 8 packets per day; i.e. Splenda)   Fluid Goal  The first goal is to drink at least 8 ounces of protein shake/drink per day (or as directed by the nutritionist); some examples of protein shakes are [employer] Johnson, [employer], EAS Edge HP, and Unjury. See handout from pre-op Bariatric Education Class:  Slowly increase the amount of protein shake you drink as tolerated  You may find it easier to slowly sip shakes throughout the day  It is important to get your proteins  in first  Your fluid goal is to drink 64 - 100 ounces of fluid daily  It may take a few weeks to build up to this  32 oz (or more) should be clear liquids  And  32 oz (or more) should be full liquids (see below for examples)  Liquids should not contain sugar, caffeine, or carbonation   Clear Liquids:  Water or Sugar-free flavored water (i.e. Fruit H2O, Propel)  Decaffeinated coffee or tea (sugar-free)  Crystal Lite, Wyler's Lite, Minute Maid Lite  Sugar-free Jell-O  Bouillon or broth  Sugar-free Popsicle: *Less than 20 calories each; Limit 1 per day   Full Liquids:  Protein Shakes/Drinks + 2 choices per day of other full liquids  Full liquids must be:  No More Than 12 grams of Carbs per serving  No More Than 3 grams of Fat per serving  Strained low-fat cream soup  Non-Fat milk  Fat-free Lactaid Milk  Sugar-free yogurt (Dannon Lite & Fit, [place] yogurt)     [place] MS, RD, [place] [phone] Pager [phone] Weekend/After Hours Pager

## 2014-03-24 NOTE — Discharge Summary (Signed)
Physician Discharge Summary  Patient ID: Kimberly Simpson J Boomhower MRN: [id number] DOB/AGE: 38/12/1975 39 y.o.  Admit date: 03/22/2014 Discharge date: 03/24/2014  Admission Diagnoses:  Anterior lapband slip  Discharge Diagnoses:  same  Active Problems:   Gastric band slippage   surgical takedown of plication and anterior slip and replication June 2015   Status post gastric banding   Admission for adjustment of gastric lap band   Surgery:  Laparoscopic takedown and replication of APS Lapband  Discharged Condition: improved  Hospital Course:   Had surgery.  Drain placed.  UGI on PD 1 OK.  Left shoulder pain from drain.  Lab OK and ready for discharge with drain out on PD 2.    Consults: none  Significant Diagnostic Studies: UGI    Discharge Exam: Blood pressure 100/69, pulse 75, temperature 98.4 F (36.9 C), temperature source Oral, resp. rate 14, height 5\' 4"  (1.626 m), weight 197 lb 4 oz (89.472 kg), SpO2 100.00%. JP out.  Incisions OK  Disposition: 01-Home or Self Care  Discharge Instructions   Discharge instructions    Complete by:  As directed   Follow bariatric 2 week post lapband diet     Increase activity slowly    Complete by:  As directed             Medication List         DULoxetine 30 MG capsule  Commonly known as:  CYMBALTA  Take 30 mg by mouth at bedtime.     eletriptan 40 MG tablet  Commonly known as:  RELPAX  One tablet by mouth as needed for migraine headache.  If the headache improves and then returns, dose may be repeated after 2 hours have elapsed since first dose (do not exceed 80 mg per day). may repeat in 2 hours if necessary     esomeprazole 40 MG capsule  Commonly known as:  NEXIUM  Take 1 capsule (40 mg total) by mouth daily at 12 noon.     HYDROcodone-acetaminophen 7.5-325 mg/15 ml solution  Commonly known as:  HYCET  Take 15 mLs by mouth every 4 (four) hours as needed for moderate pain.     ibuprofen 200 MG tablet  Commonly known as:   ADVIL,MOTRIN  Take 400 mg by mouth every 6 (six) hours as needed (Pain).     MAGNESIUM PO  Take 1 tablet by mouth daily.     multivitamin with minerals tablet  Take 1 tablet by mouth daily.           Follow-up Information   Follow up with Luretha MurphyMARTIN,MATTHEW B, MD. Schedule an appointment as soon as possible for a visit in 2 weeks.   Specialty:  General Surgery   Contact information:   [address] [place] [address] [phone]       Signed: Valarie MerinoMARTIN,MATTHEW B 03/24/2014, 1:06 PM

## 2014-04-03 ENCOUNTER — Encounter: Payer: BC Managed Care – PPO | Attending: Surgery | Admitting: Dietician

## 2014-04-03 ENCOUNTER — Encounter: Payer: Self-pay | Admitting: Dietician

## 2014-04-03 VITALS — Ht 64.0 in | Wt 193.0 lb

## 2014-04-03 DIAGNOSIS — Z713 Dietary counseling and surveillance: Secondary | ICD-10-CM | POA: Insufficient documentation

## 2014-04-03 DIAGNOSIS — Z9884 Bariatric surgery status: Secondary | ICD-10-CM | POA: Insufficient documentation

## 2014-04-03 DIAGNOSIS — E669 Obesity, unspecified: Secondary | ICD-10-CM | POA: Diagnosis present

## 2014-04-03 NOTE — Patient Instructions (Addendum)
-  Start reintroducing solid protein foods -Take it slow, chew thoroughly, and listen to your body -Aim for at least 60 grams of protein per day -Aim for at least 64 oz fluid per day -Eat protein foods first

## 2014-04-03 NOTE — Progress Notes (Signed)
  Follow-up visit: 6 years Post-Operative RYGB Surgery  Medical Nutrition Therapy:  Appt start time: 1015 end time:  1100.  Primary concerns today: Post-operative Bariatric Surgery Nutrition Management.  Kimberly Simpson is here today to "get back on track" regarding her diet. She underwent LAGB 6 years ago. After surgery she reached 159 lbs. She began having significant heartburn last fall 2014. Kimberly Simpson went to get a band fill and a swallow study a few weeks ago and it was discovered that her band had slipped. All fluid was removed from her band and it was surgically adjusted. She has since been on liquids and very soft foods: coffee with protein powder, Greek yogurt, 2-3 Atkins protein shakes, water, milkshake, and mashed potatoes. She reports that she has issues with low blood sugar. She is unable to exercise as much as she would like due to multiple back surgeries. She has an appointment for a band fill next week.    Surgery date: 2009 Surgery type: LAGB Start weight: 240 lbs Weight today: 193 lbs  TANITA  BODY COMP RESULTS  04/03/14   BMI (kg/m^2) 33   Fat Mass (lbs) 84   Fat Free Mass (lbs) 109   Total Body Water (lbs) 80   Samples given per MNT protocol. Patient educated on appropriate usage: Premier protein shake (chocolate - qty 1) Lot #: [id number] Exp: 09/2014   Preferred Learning Style:  No preference indicated   Learning Readiness:   Ready  Fluid intake: 64+ oz per day Estimated total protein intake: unknown  Medications: see list Supplementation: taking  Using straws: no Drinking while eating: sips Carbonated beverages: no N/V/D/C: heartburn Dumping syndrome: none Last Lap-Band fill: scheduled on July 14  Recent physical activity:  Walking, plans to return to the gym  Progress Towards Goal(s):  In progress.  Handouts given during visit include:  Phase 3A lean protein foods  Phase 3B lean protein + non-starchy vegetables   Nutritional Diagnosis:  [place]-3.3  Overweight/obesity related to past poor dietary habits and physical inactivity as evidenced by patient 6 years post op LAGB surgery following dietary guidelines for continued weight loss.    Intervention:  Nutrition counseling provided.  Teaching Method Utilized: Visual Auditory  Barriers to learning/adherence to lifestyle change: Lap Band slip  Demonstrated degree of understanding via:  Teach Back   Monitoring/Evaluation:  Dietary intake, exercise, lap band fills, and body weight. Follow up prn.

## 2014-04-11 ENCOUNTER — Ambulatory Visit (INDEPENDENT_AMBULATORY_CARE_PROVIDER_SITE_OTHER): Payer: BC Managed Care – PPO | Admitting: Surgery

## 2014-04-11 ENCOUNTER — Encounter (INDEPENDENT_AMBULATORY_CARE_PROVIDER_SITE_OTHER): Payer: Self-pay | Admitting: Surgery

## 2014-04-11 VITALS — BP 122/74 | HR 75 | Temp 97.5°F | Ht 64.0 in | Wt 193.0 lb

## 2014-04-11 DIAGNOSIS — Z4651 Encounter for fitting and adjustment of gastric lap band: Secondary | ICD-10-CM

## 2014-04-11 NOTE — Progress Notes (Signed)
Lapband Fill Encounter Problem List:   Patient Active Problem List   Diagnosis Date Noted  . Admission for adjustment of gastric lap band 03/23/2014  . Gastric band slippage 03/22/2014  . surgical takedown of plication and anterior slip and replication June 2015 03/22/2014  . 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 Left ankle sprain 12/15/2013  . Injury of left shoulder 12/15/2013  . [facility] 11/02/2012    Kimberly Simpson Body mass index is 33.11 kg/(m^2). Weight loss since surgery  41  Having regurgitation?:  no  Feel that they need a fill?  yes  Nocturnal reflux?  no  Amount of fill  2     Instructions given and weight loss goals discussed.    This is her first fill since her lapband revision June 24.  Today there was fluid in her band and to that I added 2 cc.  Will see her back in 4 weeks to assess for need of fill.  Matt B. Daphine DeutscherMartin, MD, FACS

## 2014-04-11 NOTE — Patient Instructions (Signed)

## 2014-04-13 ENCOUNTER — Encounter (INDEPENDENT_AMBULATORY_CARE_PROVIDER_SITE_OTHER): Payer: BC Managed Care – PPO

## 2014-05-12 ENCOUNTER — Encounter (INDEPENDENT_AMBULATORY_CARE_PROVIDER_SITE_OTHER): Payer: BC Managed Care – PPO | Admitting: Surgery

## 2014-05-31 ENCOUNTER — Encounter (HOSPITAL_COMMUNITY): Payer: Self-pay | Admitting: Emergency Medicine

## 2014-05-31 ENCOUNTER — Emergency Department (HOSPITAL_COMMUNITY)
Admission: EM | Admit: 2014-05-31 | Discharge: 2014-06-01 | Disposition: A | Discharge status: Home / Self Care | Payer: BC Managed Care – PPO | Attending: Emergency Medicine | Admitting: Emergency Medicine

## 2014-05-31 DIAGNOSIS — F329 Major depressive disorder, single episode, unspecified: Secondary | ICD-10-CM | POA: Insufficient documentation

## 2014-05-31 DIAGNOSIS — K589 Irritable bowel syndrome without diarrhea: Secondary | ICD-10-CM | POA: Insufficient documentation

## 2014-05-31 DIAGNOSIS — F3289 Other specified depressive episodes: Secondary | ICD-10-CM | POA: Insufficient documentation

## 2014-05-31 DIAGNOSIS — G43909 Migraine, unspecified, not intractable, without status migrainosus: Secondary | ICD-10-CM | POA: Diagnosis not present

## 2014-05-31 DIAGNOSIS — M79604 Pain in right leg: Secondary | ICD-10-CM

## 2014-05-31 DIAGNOSIS — Z87891 Personal history of nicotine dependence: Secondary | ICD-10-CM | POA: Insufficient documentation

## 2014-05-31 DIAGNOSIS — Z9889 Other specified postprocedural states: Secondary | ICD-10-CM | POA: Insufficient documentation

## 2014-05-31 DIAGNOSIS — M79609 Pain in unspecified limb: Secondary | ICD-10-CM | POA: Insufficient documentation

## 2014-05-31 DIAGNOSIS — Z79899 Other long term (current) drug therapy: Secondary | ICD-10-CM | POA: Diagnosis not present

## 2014-05-31 MED ORDER — ACETAMINOPHEN 325 MG PO TABS
650.0000 mg | ORAL_TABLET | Freq: Once | ORAL | Status: AC
  Administered 2014-05-31: 650 mg via ORAL
  Filled 2014-05-31: qty 2

## 2014-05-31 NOTE — ED Provider Notes (Signed)
CSN: [id number]     Arrival date & time 05/31/14  1917 History   First MD Initiated Contact with Patient 05/31/14 2201     Chief Complaint  Patient presents with  . Leg Pain     (Consider location/radiation/quality/duration/timing/severity/associated sxs/prior Treatment) HPI Comments: Patient is a 38 year old female with history of IBS, depression, GERD who presents to the emergency department today with right leg pain. She reports that she noticed the pain yesterday, but it worsened today. She took ibuprofen with minimal relief of her symptoms. The pain is throbbing. It is worse with walking. She is on the Mirena IUD. No prior history of DVT or PE. She had a lap band surgery at the end of June, approximately 8 weeks ago. No chest pain or shortness of breath.  Patient is a 38 y.o. female presenting with leg pain. The history is provided by the patient. No language interpreter was used.  Leg Pain Associated symptoms: no fever     Past Medical History  Diagnosis Date  . IBS (irritable bowel syndrome)   . Back pain   . Depression   . GERD (gastroesophageal reflux disease)   . Migraines   . Complication of anesthesia     headaches and BP drops at times   Past Surgical History  Procedure Laterality Date  . Spine surgery    . Tonsillectomy    . Back surgery      disc rupture and fusion  . Intrauterine device insertion    . Laparoscopic gastric banding  12/30/2007  . Tympanostomy tube placement    . Left knee surgery      lateral release   . Laparoscopic revision of gastric band N/A 03/22/2014    Procedure: LAPAROSCOPIC REVISION OF GASTRIC BAND;  Surgeon: Valarie Merino, MD;  Location: [facility];  Service: General;  Laterality: N/A;  . Upper gi endoscopy  03/22/2014    Procedure: UPPER GI ENDOSCOPY;  Surgeon: Valarie Merino, MD;  Location: [facility];  Service: General;;   Family History  Problem Relation Age of Onset  . Hyperlipidemia Mother   . Hypertension Mother   . Hyperlipidemia  Father    History  Substance Use Topics  . Smoking status: Former Smoker    Types: Cigarettes    Quit date: 03/18/1997  . Smokeless tobacco: Not on file  . Alcohol Use: Yes     Comment: rarely   OB History   Grav Para Term Preterm Abortions TAB SAB Ect Mult Living                 Review of Systems  Constitutional: Negative for fever and chills.  Respiratory: Negative for shortness of breath.   Cardiovascular: Negative for chest pain.  Gastrointestinal: Negative for nausea, vomiting and abdominal pain.  Musculoskeletal: Positive for myalgias.  All other systems reviewed and are negative.     Allergies  Ciprofloxacin  Home Medications   Prior to Admission medications   Medication Sig Start Date End Date Taking? Authorizing Provider  eletriptan (RELPAX) 40 MG tablet One tablet by mouth as needed for migraine headache.  If the headache improves and then returns, dose may be repeated after 2 hours have elapsed since first dose (do not exceed 80 mg per day). may repeat in 2 hours if necessary    Yes Historical Provider, MD  ibuprofen (ADVIL,MOTRIN) 200 MG tablet Take 400 mg by mouth every 6 (six) hours as needed (Pain).   Yes Historical Provider, MD  MAGNESIUM  PO Take 1 tablet by mouth daily.   Yes Historical Provider, MD  Multiple Vitamins-Minerals (MULTIVITAMIN WITH MINERALS) tablet Take 1 tablet by mouth daily.   Yes Historical Provider, MD  venlafaxine XR (EFFEXOR-XR) 37.5 MG 24 hr capsule Take 37.5 mg by mouth at bedtime.   Yes Historical Provider, MD   BP 108/66  Pulse 89  Temp(Src) 98.4 F (36.9 C) (Oral)  Resp 18  SpO2 100% Physical Exam  Nursing note and vitals reviewed. Constitutional: She is oriented to person, place, and time. She appears well-developed and well-nourished. She does not appear ill. No distress.  HENT:  Head: Normocephalic and atraumatic.  Right Ear: External ear normal.  Left Ear: External ear normal.  Nose: Nose normal.  Mouth/Throat:  Oropharynx is clear and moist.  Eyes: Conjunctivae are normal.  Neck: Normal range of motion.  Cardiovascular: Normal rate, regular rhythm, normal heart sounds, intact distal pulses and normal pulses.   Pulses:      Dorsalis pedis pulses are 2+ on the right side, and 2+ on the left side.       Posterior tibial pulses are 2+ on the right side, and 2+ on the left side.  No calf swelling, erythema, or collateral veins present.  No edema  Pulmonary/Chest: Effort normal and breath sounds normal. No stridor. No respiratory distress. She has no wheezes. She has no rales.  Abdominal: Soft. She exhibits no distension.  Musculoskeletal: Normal range of motion.       Legs: Tender to palpation to lateral aspect of right lower leg.   Neurological: She is alert and oriented to person, place, and time. She has normal strength.  Skin: Skin is warm and dry. She is not diaphoretic. No erythema.  Psychiatric: She has a normal mood and affect. Her behavior is normal.    ED Course  Procedures (including critical care time) Labs Review Labs Reviewed  D-DIMER, QUANTITATIVE    Imaging Review No results found.   EKG Interpretation None      MDM   Final diagnoses:  Right leg pain    Patient presents to ED with right leg pain. Surgery 8 weeks ago. Well's score 1 for DVT putting her in low risk group. D dimer is negative. Patient was encouraged to follow up with PCP. Discussed reason to return to ED immediately including chest pain, shortness of breath, or worsening of her symptoms. Discussed case with Dr. Fredderick Phenix who agrees with plan. Vital signs stable for discharge. Patient / Family / Caregiver informed of clinical course, understand medical decision-making process, and agree with plan.     Mora Bellman, PA-C 06/02/14 2153

## 2014-05-31 NOTE — ED Notes (Signed)
Pt reports R calf pain, worse with weight bearing.  Denies injury.  No swelling noted.

## 2014-05-31 NOTE — ED Notes (Signed)
Pt c/o pain to rt calf; pt describes pain as throbbing to calf when just sitting and shooting pain to knee and ankle when walking; increase pain when foot is manipulated; no redness or swelling noted to calf

## 2014-06-01 LAB — D-DIMER, QUANTITATIVE: D-Dimer, Quant: <0.27 ug/mL-FEU (ref 0.00–0.48)

## 2014-06-01 MED ORDER — TRAMADOL HCL 50 MG PO TABS: 50.0000 mg | ORAL_TABLET | Freq: Four times a day (QID) | ORAL | Status: DC | PRN

## 2014-06-01 NOTE — Discharge Instructions (Signed)

## 2014-06-04 NOTE — ED Provider Notes (Signed)
Medical screening examination/treatment/procedure(s) were performed by non-physician practitioner and as supervising physician I was immediately available for consultation/collaboration.   EKG Interpretation None        Kista Robb, MD 06/04/14 0705 

## 2014-06-14 ENCOUNTER — Encounter (INDEPENDENT_AMBULATORY_CARE_PROVIDER_SITE_OTHER): Payer: BC Managed Care – PPO | Admitting: Surgery

## 2014-12-14 ENCOUNTER — Other Ambulatory Visit (INDEPENDENT_AMBULATORY_CARE_PROVIDER_SITE_OTHER): Payer: Self-pay

## 2014-12-14 DIAGNOSIS — R109 Unspecified abdominal pain: Secondary | ICD-10-CM

## 2014-12-14 DIAGNOSIS — R1030 Lower abdominal pain, unspecified: Secondary | ICD-10-CM

## 2014-12-15 ENCOUNTER — Ambulatory Visit
Admission: RE | Admit: 2014-12-15 | Discharge: 2014-12-15 | Disposition: A | Discharge status: Home / Self Care | Payer: BC Managed Care – PPO | Source: Ambulatory Visit | Attending: General Surgery | Admitting: General Surgery

## 2015-06-10 IMAGING — CR DG ABDOMEN 2V
2 series · 2 of 2 positions shown · non-contrast
Comparison: Upper GI 03/23/2014

CLINICAL DATA: History of gastric band surgery.  Abdominal pain

EXAM:
ABDOMEN - 2 VIEW

[w abdomen upright *]
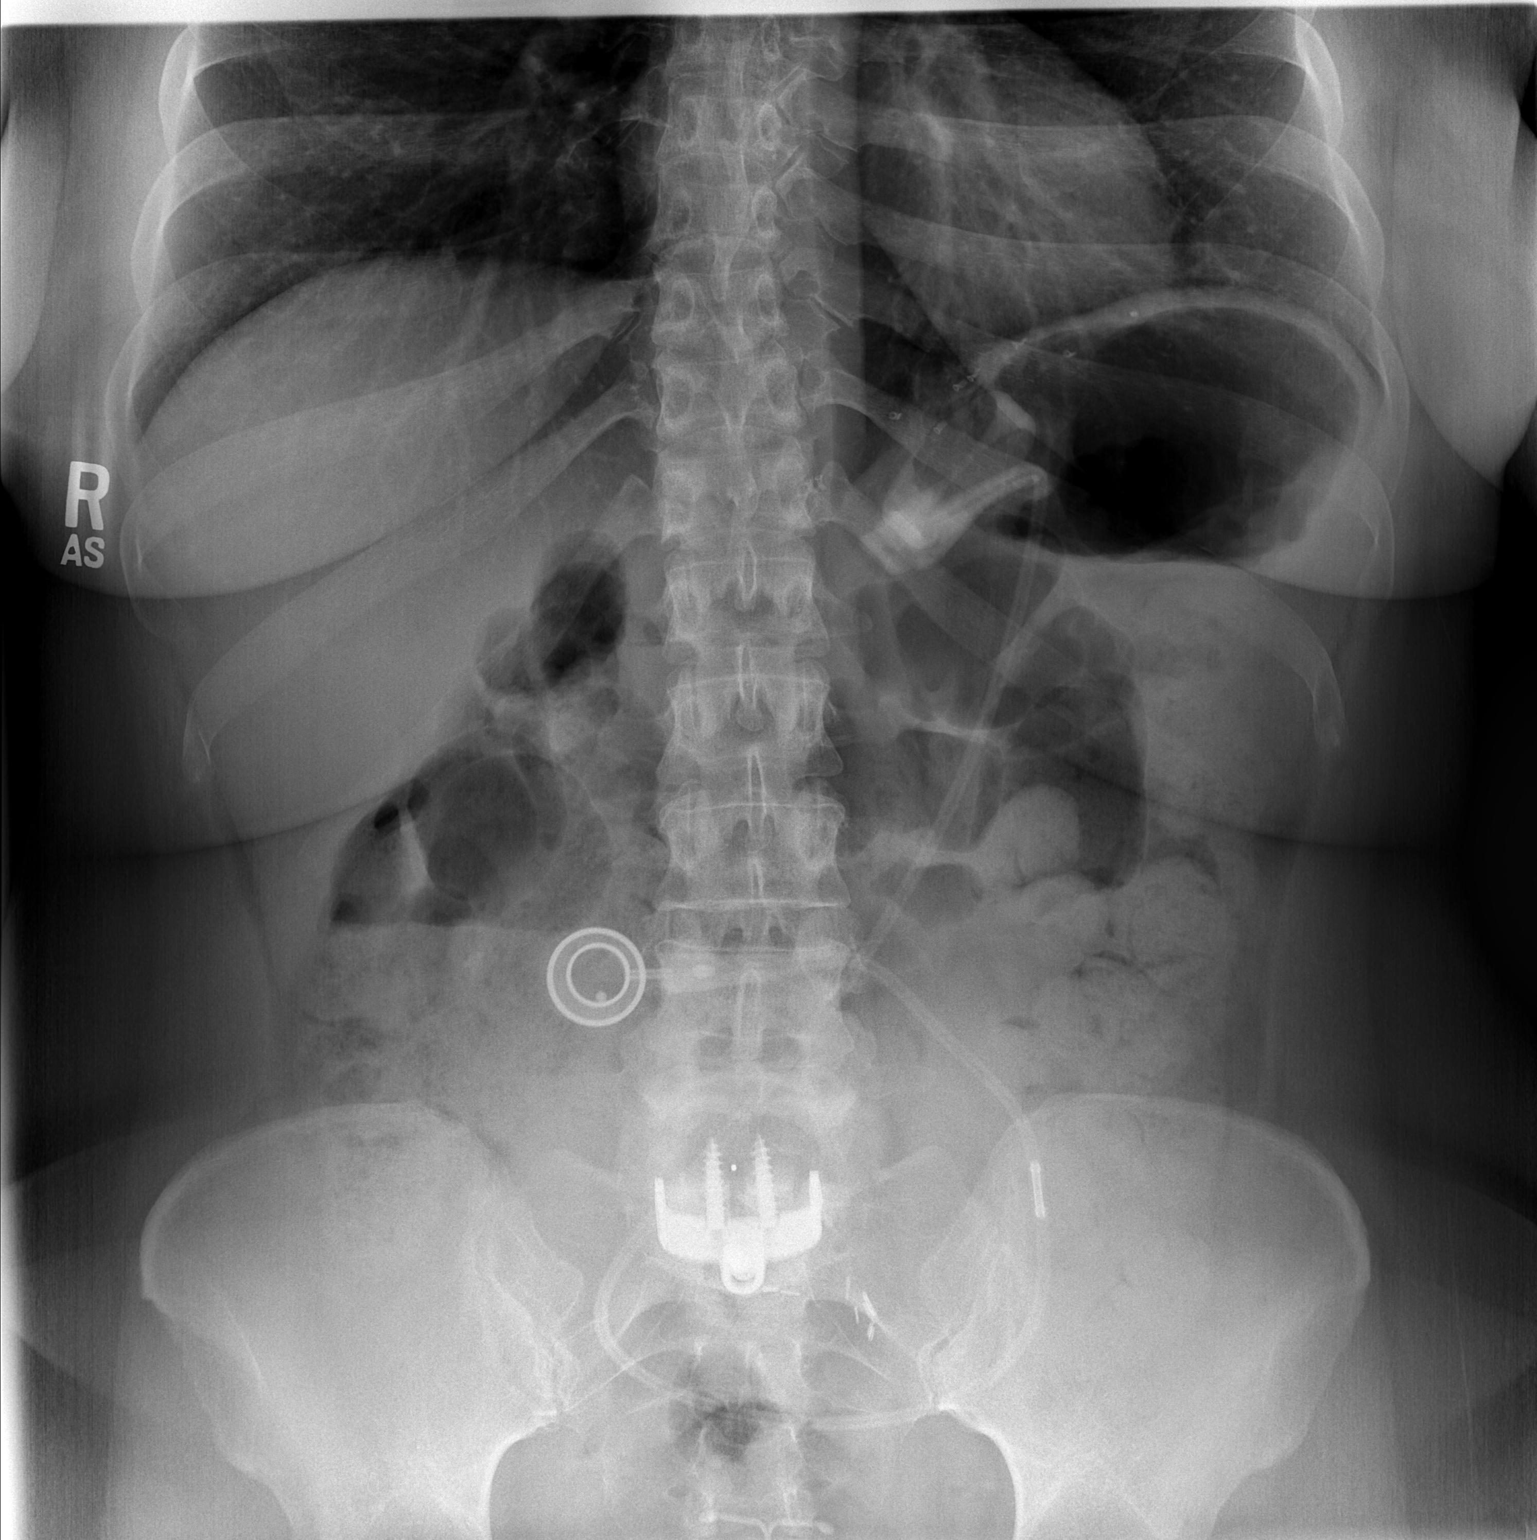

[t abdomen supine]
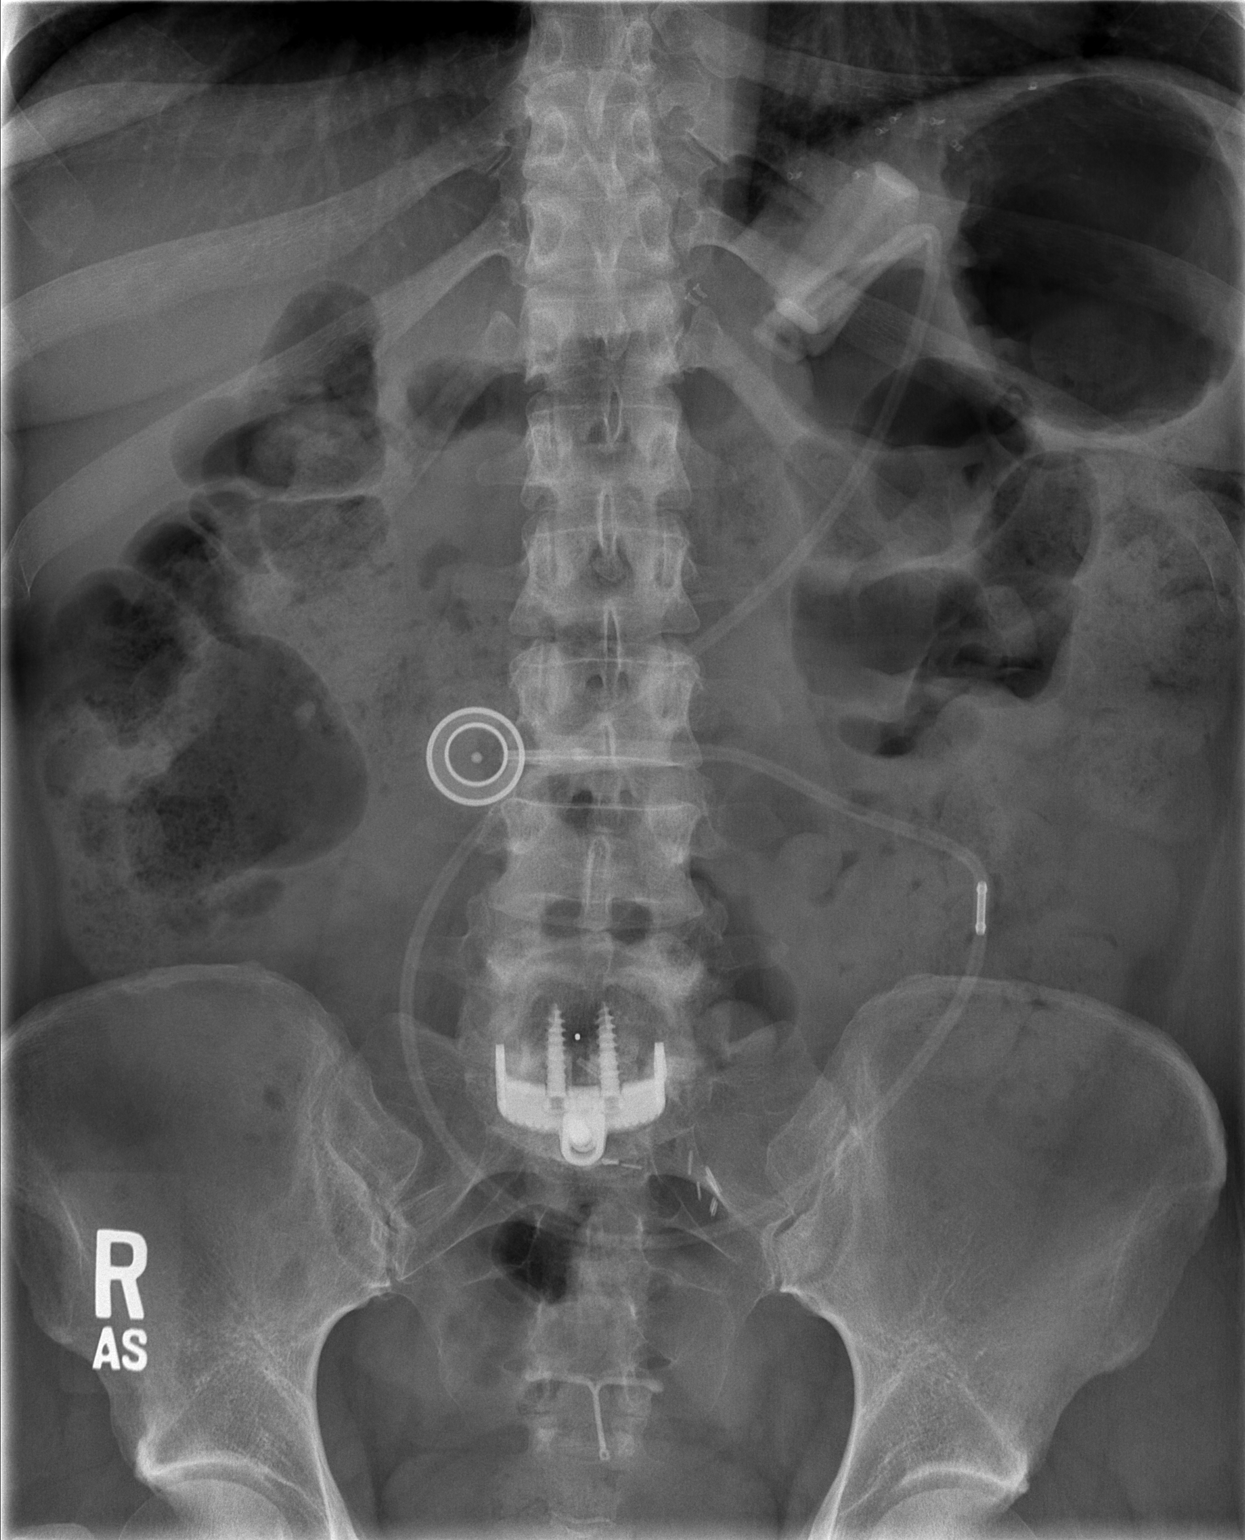

[2 of 2 positions shown; findings below may reference images not displayed]

FINDINGS: Gastric band is positioned around the gastric fundus. The band is in
the [DATE] to [DATE] position and is more vertical than seen on the
prior study. Tubing is continuous.

Constipation with stool throughout the bowel. Negative for bowel
obstruction or free air.

IUD noted.  Surgical fusion L5-S1 from an anterior approach.
IMPRESSION: Gastric band in satisfactory position at the [DATE] to [DATE] position

Constipation without bowel obstruction.

## 2016-05-12 ENCOUNTER — Other Ambulatory Visit: Payer: Self-pay | Admitting: Family Medicine

## 2017-04-07 ENCOUNTER — Encounter: Payer: Self-pay | Admitting: Neurology

## 2017-04-08 ENCOUNTER — Encounter: Payer: Self-pay | Admitting: Neurology

## 2017-04-08 ENCOUNTER — Encounter (INDEPENDENT_AMBULATORY_CARE_PROVIDER_SITE_OTHER): Payer: Self-pay

## 2017-04-08 ENCOUNTER — Ambulatory Visit (INDEPENDENT_AMBULATORY_CARE_PROVIDER_SITE_OTHER): Payer: Self-pay | Admitting: Neurology

## 2017-04-08 VITALS — BP 106/74 | HR 91 | Ht 64.0 in | Wt 209.0 lb

## 2017-04-08 DIAGNOSIS — M542 Cervicalgia: Secondary | ICD-10-CM

## 2017-04-08 NOTE — Progress Notes (Signed)
Open in error. CD

## 2017-04-13 ENCOUNTER — Ambulatory Visit (INDEPENDENT_AMBULATORY_CARE_PROVIDER_SITE_OTHER): Payer: BC Managed Care – PPO | Admitting: Neurology

## 2017-04-13 ENCOUNTER — Encounter: Payer: Self-pay | Admitting: Neurology

## 2017-04-13 VITALS — BP 105/74 | HR 85 | Ht 64.5 in | Wt 209.0 lb

## 2017-04-13 DIAGNOSIS — G243 Spasmodic torticollis: Secondary | ICD-10-CM

## 2017-04-13 DIAGNOSIS — G43709 Chronic migraine without aura, not intractable, without status migrainosus: Secondary | ICD-10-CM

## 2017-04-13 DIAGNOSIS — G5603 Carpal tunnel syndrome, bilateral upper limbs: Secondary | ICD-10-CM | POA: Diagnosis not present

## 2017-04-13 NOTE — Patient Instructions (Addendum)
Remember to drink plenty of fluid, eat healthy meals and do not skip any meals. Try to eat protein with a every meal and eat a healthy snack such as fruit or nuts in between meals. Try to keep a regular sleep-wake schedule and try to exercise daily, particularly in the form of walking, 20-30 minutes a day, if you can.   As far as your medications are concerned, I would like to suggest:  Botox for migraines Botox for Cervical dystonia Integrative Therapies   As far as diagnostic testing: emg/ncs  I would like to see you back for emg/ncs, sooner if we need to. Please call us with any interim questions, concerns, problems, updates or refill requests.   Please also call us for any test results so we can go over those with you on the phone.  My clinical assistant and will answer any of your questions and relay your messages to me and also relay most of my messages to you.   Our phone number is [phone]. We also have an after hours call service for urgent matters and there is a physician on-call for urgent questions. For any emergencies you know to call 911 or go to the nearest emergency room  Topamax 25mg  for one week, then 50mg  for one week then 100mg 

## 2017-04-13 NOTE — Progress Notes (Addendum)
[facility]    Provider:  Dr Lucia GaskinsAhern Referring Provider: Clayborn Heronankins, Victoria R, MD Primary Care Physician:  Clayborn Heronankins, Victoria R, MD  CC:  Neck pain  HPI:  Kimberly Simpson is a 41 y.o. female here as a referral from Dr. Barbaraann Barthelankins for neck pain nad cervical radiculopathy. 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 She had lumbar surgery and spinal fusion with residual foot drop. 3 years ago her neck started bothering her. She fell around that time. She started having problems with lifting her arms. She has pain and numbness with lifting arms. She has had nerve conduction studies. 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 She had injections and the last one increased her pain in June. She tried steroids. Numbness is in digits 1-3 of most hands. She gets headaches due to the neck pain. She is getting botox for her migraines at a different neurology office [facility]. Her left arm feels heavy. She has decreased ROM in the cervical muscles. She has daily headaches, migraines are on the right, throbbing and pounding, light and souns sensitivity, nausea, last 24-48 hours and she has 8 migraine days a month. No aura. No medication overuse. Chronic migraines for years.  She was receiving botox with great results, a 50% reduction in headache days and >50% reduction in migraine days per month. No other focal neurologic deficits, associated symptoms, inciting events or modifiable factors. No aura. No medication overuse. Has failed multiple medications for migraine including: Topiramate, Neurontin, Venlafaxine, cymbalta and other medications such as propranolol.  Has failed multiple meds for cervical dystonia: Robaxin, neurontin, tylenol, ibuprofen, steroids, cymbalta, other muscle relaxers such as flexeril,  PT, Chiropractic care.   Reviewed notes, labs and imaging from outside physicians, which showed:  [facility] Patient is on Relpax, methocarbamol, gabapentin and meloxicam. She has numbness and tingling in both hands, degeneration of her intervertebral disc at C6-C7 with radiculopathy. She has central neck pain, is getting injections, has been to a chiropractor, she has pain in the left side of her neck to the left arm, pain at night, stiffness, weakness and numbness bilateral hands and fingers while the patient does not report symptoms of swelling, aching, throbbing, locking, catching, popping, grinding giving way, instability. No difficulty ambulating. The repeat MRI of her cervical spine was stable compared to her 2017 MRI. No cord signal changes. No significant stenosis. No neural compression. Findings on cervical MRI very mild.   Reviewed MRI of the cervical spine report (do not have images) which showed no interval change since 2017. At C6-C7 mild disc bulge with flattening of the ventral thecal sac no stenosis otherwise no cervical cord or intraspinal lesion, no disc herniation is central stenosis or foraminal stenosis.  Review of Systems: Patient complains of symptoms per HPI as well as the following symptoms: no CP, no SOB. Pertinent negatives and positives per HPI. All others negative.   Social History   Social History  . Marital status: Widowed    Spouse name: N/A  . Number of children: 2  . Years of education: college   Occupational History  . Teacher    Social History Main Topics  . Smoking status: Former Smoker    Packs/day: 0.50    Types: Cigarettes    Quit date: 03/18/1997  . Smokeless tobacco: Never Used  . Alcohol use  Yes     Comment: rarely  . Drug use: No  . Sexual activity: Yes    Birth control/ protection: IUD   Other Topics Concern  . Not on file   Social History Narrative   Lives at home with her older son.   Right-handed.   1-2  cups caffeine daily.    Family History  Problem Relation Age of Onset  . Hypertension Mother   . Hyperlipidemia Father   . Rheum arthritis Father   . Hypertension Paternal Grandmother   . Heart failure Paternal Grandmother     Past Medical History:  Diagnosis Date  . Anxiety   . Back pain   . Cervical pain   . Complication of anesthesia    headaches and BP drops at times  . Depression   . Fibromyalgia   . GERD (gastroesophageal reflux disease)   . IBS (irritable bowel syndrome)   . Migraines     Past Surgical History:  Procedure Laterality Date  . BACK SURGERY     disc rupture and fusion  . INTRAUTERINE DEVICE INSERTION    . LAPAROSCOPIC GASTRIC BANDING  12/30/2007  . LAPAROSCOPIC REVISION OF GASTRIC BAND N/A 03/22/2014   Procedure: LAPAROSCOPIC REVISION OF GASTRIC BAND;  Surgeon: Valarie Merino, MD;  Location: [facility];  Service: General;  Laterality: N/A;  . left knee surgery     lateral release   . SPINE SURGERY    . TONSILLECTOMY    . TYMPANOSTOMY TUBE PLACEMENT    . UPPER GI ENDOSCOPY  03/22/2014   Procedure: UPPER GI ENDOSCOPY;  Surgeon: Valarie Merino, MD;  Location: [facility];  Service: General;;    Current Outpatient Prescriptions  Medication Sig Dispense Refill  . eletriptan (RELPAX) 40 MG tablet One tablet by mouth as needed for migraine headache.  If the headache improves and then returns, dose may be repeated after 2 hours have elapsed since first dose (do not exceed 80 mg per day). may repeat in 2 hours if necessary     . gabapentin (NEURONTIN) 100 MG capsule Take 100 mg by mouth at bedtime. Extra 100mg  taken during menstrual cycle for pain.    Marland Kitchen gabapentin (NEURONTIN) 300 MG capsule Take 300 mg by mouth 2 (two) times daily.     Marland Kitchen ibuprofen (ADVIL,MOTRIN) 200 MG tablet Take 400 mg by mouth every 6 (six) hours as needed (Pain).    . meloxicam (MOBIC) 15 MG tablet Take 15 mg by mouth daily.    . methocarbamol (ROBAXIN) 750 MG tablet Take 750 mg by mouth as  needed.     . phentermine 37.5 MG capsule TAKE ONE CAPSULE BY MOUTH EVERY DAY    . Riboflavin-Magnesium-Feverfew (MIGRELIEF) 200-180-50 MG TABS Take by mouth 2 (two) times daily.    Marland Kitchen venlafaxine XR (EFFEXOR-XR) 37.5 MG 24 hr capsule Take 37.5 mg by mouth at bedtime.     No current facility-administered medications for this visit.     Allergies as of 04/13/2017 - Review Complete 04/13/2017  Allergen Reaction Noted  . Ciprofloxacin Shortness Of Breath and Rash 03/21/2014  . Shellfish allergy Hives and Shortness Of Breath 04/08/2017  . Latex Rash 04/08/2017    Vitals: BP 105/74   Pulse 85   Ht 5' 4.5" (1.638 m)   Wt 209 lb (94.8 kg)   BMI 35.32 kg/m  Last Weight:  Wt Readings from Last 1 Encounters:  04/13/17 209 lb (94.8 kg)   Last Height:   Ht Readings from  Last 1 Encounters:  04/13/17 5' 4.5" (1.638 m)    Physical exam: Exam: Gen: NAD, conversant, well nourised, obese, well groomed                     CV: RRR, no MRG. No Carotid Bruits. No peripheral edema, warm, nontender Eyes: Conjunctivae clear without exudates or hemorrhage MSK: Bilateral shoulder elevation, right torticollis and laterocollis with reduced ROM  Neuro: Detailed Neurologic Exam  Speech:    Speech is normal; fluent and spontaneous with normal comprehension.  Cognition:    The patient is oriented to person, place, and time;     recent and remote memory intact;     language fluent;     normal attention, concentration,     fund of knowledge Cranial Nerves:    The pupils are equal, round, and reactive to light. The fundi are normal and spontaneous venous pulsations are present. Visual fields are full to finger confrontation. Extraocular movements are intact. Trigeminal sensation is intact and the muscles of mastication are normal. The face is symmetric. The palate elevates in the midline. Hearing intact. Voice is normal. Shoulder shrug is normal. The tongue has normal motion without fasciculations.    Coordination:    Normal finger to nose and heel to shin. Normal rapid alternating movements.   Gait:    Heel-toe and tandem gait are normal.   Motor Observation:    No asymmetry, no atrophy, and no involuntary movements noted. Tone:    Normal muscle tone.    Posture:    Posture is normal. normal erect    Strength:    Strength is V/V in the upper and lower limbs.      Sensation: intact to LT     Reflex Exam:  DTR's:    Deep tendon reflexes in the upper and lower extremities are normal bilaterally.   Toes:    The toes are downgoing bilaterally.   Clonus:    Clonus is absent.      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   Cervical dystonia: Bilateral shoulder elevation, right torticollis and laterocollis with reduced ROM. Has failed multiple muscle relaxers, PT. Progressively worsening range of motion and pain. Recommend botox therapy. EMG/NCS: bilateral uppers for CTS Migraine: Already getting botox, will transfer care to our clinic Integrative therapies: massage, PT, acunpuncture for musculoskeletal neck pain, migraine and cervicalgia Topiramate ER: start with 25mg  then increase to 50mg  then 100mg . Slowly decrease Neurontin.  Discussed teratogenicity.   Discussed: To prevent or relieve headaches, try the following: Cool Compress. Lie down and place a cool compress on your head.  Avoid headache triggers. If certain foods or odors seem to have triggered your migraines in the past, avoid them. A headache diary might help you identify triggers.  Include physical activity in your daily routine. Try a daily walk or other moderate aerobic exercise.  Manage stress. Find healthy ways to cope with the stressors, such as delegating tasks on your to-do list.  Practice relaxation techniques. Try deep breathing, yoga, massage and visualization.  Eat regularly. Eating regularly scheduled meals and maintaining a healthy diet might help prevent  headaches. Also, drink plenty of fluids.  Follow a regular sleep schedule. Sleep deprivation might contribute to headaches Consider biofeedback. With this mind-body technique, you learn to control certain bodily functions - such as muscle tension, heart rate and blood pressure - to prevent headaches or reduce headache pain.    Proceed to  emergency room if you experience new or worsening symptoms or symptoms do not resolve, if you have new neurologic symptoms or if headache is severe, or for any concerning symptom.   Provided education and documentation from American headache Society toolbox including articles on: chronic migraine medication overuse headache, chronic migraines, prevention of migraines, behavioral and other nonpharmacologic treatments for headache.  Cc" Dr. Ardelia Mems, MD  [facility] [address] [place], [place] [address]  Phone [phone] Fax [phone]

## 2017-04-23 ENCOUNTER — Ambulatory Visit (INDEPENDENT_AMBULATORY_CARE_PROVIDER_SITE_OTHER): Payer: BC Managed Care – PPO | Admitting: Neurology

## 2017-04-23 ENCOUNTER — Ambulatory Visit (INDEPENDENT_AMBULATORY_CARE_PROVIDER_SITE_OTHER): Payer: Self-pay | Admitting: Neurology

## 2017-04-23 DIAGNOSIS — G5600 Carpal tunnel syndrome, unspecified upper limb: Secondary | ICD-10-CM

## 2017-04-23 DIAGNOSIS — G5601 Carpal tunnel syndrome, right upper limb: Secondary | ICD-10-CM

## 2017-04-23 DIAGNOSIS — G5603 Carpal tunnel syndrome, bilateral upper limbs: Secondary | ICD-10-CM

## 2017-04-23 DIAGNOSIS — Z0289 Encounter for other administrative examinations: Secondary | ICD-10-CM

## 2017-04-24 ENCOUNTER — Telehealth: Payer: Self-pay

## 2017-04-24 NOTE — Telephone Encounter (Signed)
Received PT eval w/ plan for visits 2x/wk for 8-12 wks w/ a reassessment every 6 wks or as needed. Sent to med records for scanning, copy to Dr. Lucia GaskinsAhern for review.

## 2017-04-25 NOTE — Progress Notes (Signed)
See procedure note.

## 2017-04-25 NOTE — Progress Notes (Signed)
Full Name: Kimberly KirschnerLaura Simpson Gender: Female MRN #: [id number] Date of Birth: 1975-11-18    Visit Date: 04/23/17 08:27 Age: 41 Years 9 Months Old Examining Physician: Kimberly DeanAntonia Ahern, MD     History: Here for evaluation of Carpal Tunnel Syndrome  Summary: The right median motor nerve showed delayed distal onset latency (4.108ms, N<4.4).  The right orthodromic median sensory nerve showed delayed distal peak latency(3.79ms, N<3.4). The left orthodromic median sensory nerve showed delayed distal peak latency(3.946ms, N<3.4). All remaining nerves (as detailed in the following tables) were within normal limits. All muscles (as detailed in the following tables) were within normal limits.  Conclusion: There is electrophysiologic evidence for moderately-severe right and mild left Carpal Tunnel Syndrome.  Kimberly DeanAntonia Simpson M.D.  [facility] [address] [place], [place] [address] Tel: [phone] Fax: [phone]        [facility]    Nerve / Sites Muscle Latency Ref. Amplitude Ref. Rel Amp Segments Distance Velocity Ref. Area    ms ms mV mV %  cm m/s m/s mVms  R Median - APB     Wrist APB 4.8 ?4.4 7.7 ?4.0 100 Wrist - APB 7   31.5     Upper arm APB 8.0  7.5  98.2 Upper arm - Wrist 18 56 ?49 31.0  L Median - APB     Wrist APB 3.8 ?4.4 6.0 ?4.0 100 Wrist - APB 7   25.0     Upper arm APB 7.6  5.7  95 Upper arm - Wrist 19 51 ?49 24.0  R Ulnar - ADM     Wrist ADM 2.9 ?3.3 10.6 ?6.0 100 Wrist - ADM 7   34.5     B.Elbow ADM 5.5  9.8  92.7 B.Elbow - Wrist 16 60 ?49 35.5     A.Elbow ADM 7.7  9.8  99.6 A.Elbow - B.Elbow 12 55 ?49 35.7         A.Elbow - Wrist      L Ulnar - ADM     Wrist ADM 2.7 ?3.3 7.8 ?6.0 100 Wrist - ADM 7   31.1     B.Elbow ADM 5.7  6.8  87.8 B.Elbow - Wrist 16 53 ?49 28.0     A.Elbow ADM 8.0  7.3  107 A.Elbow - B.Elbow 12 54 ?49 28.7         A.Elbow - Wrist                 SNC    Nerve / Sites Rec. Site Peak Lat Ref.  Amp Ref. Segments Distance    ms ms V V   cm  R Median - Orthodromic (Dig II, Mid palm)     Dig II Wrist 3.9 ?3.4 11 ?10 Dig II - Wrist 13  L Median - Orthodromic (Dig II, Mid palm)     Dig II Wrist 3.6 ?3.4 14 ?10 Dig II - Wrist 13  R Ulnar - Orthodromic, (Dig V, Mid palm)     Dig V Wrist 2.6 ?3.1 11 ?5 Dig V - Wrist 11  L Ulnar - Orthodromic, (Dig V, Mid palm)     Dig V Wrist 2.7 ?3.1 18 ?5 Dig V - Wrist 3411             F  Wave    Nerve F Lat Ref.   ms ms  R Ulnar - ADM 26.5 ?32.0  L Ulnar - ADM 25.7 ?32.0  EMG full       EMG Summary Table    Spontaneous MUAP Recruitment  Muscle IA Fib PSW Fasc Other Amp Dur. Poly Pattern  R. Deltoid Normal None None None _______ Normal Normal Normal Normal  R. Triceps brachii Normal None None None _______ Normal Normal Normal Normal  L. Pronator teres Normal None None None _______ Normal Normal Normal Normal  R. Pronator teres Normal None None None _______ Normal Normal Normal Normal  L. First dorsal interosseous Normal None None None _______ Normal Normal Normal Normal  R. First dorsal interosseous Normal None None None _______ Normal Normal Normal Normal  L. Opponens pollicis Normal None None None _______ Normal Normal Normal Normal  R. Opponens pollicis Normal None None None _______ Normal Normal Normal Normal  L. Deltoid Normal None None None _______ Normal Normal Normal Normal  L. Triceps brachii Normal None None None _______ Normal Normal Normal Normal  Bilateral Cervical paraspinals (low) Normal None None None _______ Normal Normal Normal Normal

## 2017-04-26 NOTE — Addendum Note (Signed)
Addended by: Naomie DeanAHERN, ANTONIA B on: 04/26/2017 09:08 PM   Modules accepted: Orders

## 2017-04-26 NOTE — Procedures (Signed)
Full Name: Kimberly KirschnerLaura Simpson Gender: Female MRN #: [id number] Date of Birth: 1975-11-18    Visit Date: 04/23/17 08:27 Age: 41 Years 9 Months Old Examining Physician: Naomie DeanAntonia Ahern, MD     History: Here for evaluation of Carpal Tunnel Syndrome  Summary: The right median motor nerve showed delayed distal onset latency (4.108ms, N<4.4).  The right orthodromic median sensory nerve showed delayed distal peak latency(3.79ms, N<3.4). The left orthodromic median sensory nerve showed delayed distal peak latency(3.946ms, N<3.4). All remaining nerves (as detailed in the following tables) were within normal limits. All muscles (as detailed in the following tables) were within normal limits.  Conclusion: There is electrophysiologic evidence for moderately-severe right and mild left Carpal Tunnel Syndrome.  Naomie DeanAntonia Ahern M.D.  [facility] [address] [place], [place] [address] Tel: [phone] Fax: [phone]        [facility]    Nerve / Sites Muscle Latency Ref. Amplitude Ref. Rel Amp Segments Distance Velocity Ref. Area    ms ms mV mV %  cm m/s m/s mVms  R Median - APB     Wrist APB 4.8 ?4.4 7.7 ?4.0 100 Wrist - APB 7   31.5     Upper arm APB 8.0  7.5  98.2 Upper arm - Wrist 18 56 ?49 31.0  L Median - APB     Wrist APB 3.8 ?4.4 6.0 ?4.0 100 Wrist - APB 7   25.0     Upper arm APB 7.6  5.7  95 Upper arm - Wrist 19 51 ?49 24.0  R Ulnar - ADM     Wrist ADM 2.9 ?3.3 10.6 ?6.0 100 Wrist - ADM 7   34.5     B.Elbow ADM 5.5  9.8  92.7 B.Elbow - Wrist 16 60 ?49 35.5     A.Elbow ADM 7.7  9.8  99.6 A.Elbow - B.Elbow 12 55 ?49 35.7         A.Elbow - Wrist      L Ulnar - ADM     Wrist ADM 2.7 ?3.3 7.8 ?6.0 100 Wrist - ADM 7   31.1     B.Elbow ADM 5.7  6.8  87.8 B.Elbow - Wrist 16 53 ?49 28.0     A.Elbow ADM 8.0  7.3  107 A.Elbow - B.Elbow 12 54 ?49 28.7         A.Elbow - Wrist                 SNC    Nerve / Sites Rec. Site Peak Lat Ref.  Amp Ref. Segments Distance    ms ms V V   cm  R Median - Orthodromic (Dig II, Mid palm)     Dig II Wrist 3.9 ?3.4 11 ?10 Dig II - Wrist 13  L Median - Orthodromic (Dig II, Mid palm)     Dig II Wrist 3.6 ?3.4 14 ?10 Dig II - Wrist 13  R Ulnar - Orthodromic, (Dig V, Mid palm)     Dig V Wrist 2.6 ?3.1 11 ?5 Dig V - Wrist 11  L Ulnar - Orthodromic, (Dig V, Mid palm)     Dig V Wrist 2.7 ?3.1 18 ?5 Dig V - Wrist 3411             F  Wave    Nerve F Lat Ref.   ms ms  R Ulnar - ADM 26.5 ?32.0  L Ulnar - ADM 25.7 ?32.0  EMG full       EMG Summary Table    Spontaneous MUAP Recruitment  Muscle IA Fib PSW Fasc Other Amp Dur. Poly Pattern  R. Deltoid Normal None None None _______ Normal Normal Normal Normal  R. Triceps brachii Normal None None None _______ Normal Normal Normal Normal  L. Pronator teres Normal None None None _______ Normal Normal Normal Normal  R. Pronator teres Normal None None None _______ Normal Normal Normal Normal  L. First dorsal interosseous Normal None None None _______ Normal Normal Normal Normal  R. First dorsal interosseous Normal None None None _______ Normal Normal Normal Normal  L. Opponens pollicis Normal None None None _______ Normal Normal Normal Normal  R. Opponens pollicis Normal None None None _______ Normal Normal Normal Normal  L. Deltoid Normal None None None _______ Normal Normal Normal Normal  L. Triceps brachii Normal None None None _______ Normal Normal Normal Normal  Bilateral Cervical paraspinals (low) Normal None None None _______ Normal Normal Normal Normal      

## 2017-04-29 HISTORY — PX: CARPAL TUNNEL RELEASE: SHX101

## 2017-05-27 ENCOUNTER — Ambulatory Visit: Payer: BC Managed Care – PPO | Admitting: Neurology

## 2017-06-03 ENCOUNTER — Telehealth: Payer: Self-pay | Admitting: Neurology

## 2017-06-03 NOTE — Telephone Encounter (Signed)
Patient calling to discuss getting Botox. She previously had at a different practice and says has discussed this with you before.

## 2017-06-09 ENCOUNTER — Other Ambulatory Visit: Payer: Self-pay | Admitting: Neurology

## 2017-06-09 MED ORDER — TOPIRAMATE 100 MG PO TABS: 100.0000 mg | ORAL_TABLET | Freq: Every day | ORAL | 5 refills | Status: DC

## 2017-06-09 NOTE — Telephone Encounter (Signed)
Pt request refill for topamax sent [facility]. Pt said she has 1 tab left

## 2017-06-09 NOTE — Telephone Encounter (Signed)
I spoke with the patient regarding her botox. I told her I would call and check status with the insurance.

## 2017-06-09 NOTE — Telephone Encounter (Signed)
Called and spoke with patient. Verified she is taking topamax 100mg  daily. Verified pharmacy info. She was at pharmacy now and would like rx called in. Botox appt cx d/t insurance issues. She was just seen 04/13/17.  E-scribed refills to pharmacy as requested.

## 2017-06-09 NOTE — Addendum Note (Signed)
Addended by: Hillis RangeKING, EMMA L on: 06/09/2017 05:02 PM   Modules accepted: Orders

## 2017-06-09 NOTE — Telephone Encounter (Signed)
This patients botox denial is going to require a peer to peer. A peer to peer has been scheduled for tomorrow 06/10/17 for 3:30 p.m.-3:45, they will call our office during this time for the peer to peer.

## 2017-06-10 NOTE — Telephone Encounter (Signed)
I have a patient at this time can you take the call?

## 2017-06-15 NOTE — Telephone Encounter (Signed)
I called and spoke with the insurance, I am not allowed to do P2P it must be the doctor, a NP or PA. They will also accept a nurse but it is not preferred. They will call today between 4:15-4:30. They will call during this time, please let me know if this will not work.

## 2017-06-15 NOTE — Telephone Encounter (Signed)
Is this for botox migraine or for the dystonia?

## 2017-06-16 NOTE — Telephone Encounter (Signed)
Chronic Migraine

## 2017-06-17 ENCOUNTER — Other Ambulatory Visit: Payer: Self-pay | Admitting: Family Medicine

## 2017-06-17 ENCOUNTER — Other Ambulatory Visit (HOSPITAL_COMMUNITY)
Admission: RE | Admit: 2017-06-17 | Discharge: 2017-06-17 | Disposition: A | Discharge status: Home / Self Care | Payer: BC Managed Care – PPO | Source: Ambulatory Visit | Attending: Family Medicine | Admitting: Family Medicine

## 2017-06-17 DIAGNOSIS — Z124 Encounter for screening for malignant neoplasm of cervix: Secondary | ICD-10-CM | POA: Diagnosis present

## 2017-06-17 NOTE — Telephone Encounter (Signed)
What is the number to call to set this up? I need to schedule it when I can take it thanks

## 2017-06-19 LAB — CYTOLOGY - PAP
DIAGNOSIS: NEGATIVE
HPV (WINDOPATH): NOT DETECTED

## 2017-06-30 ENCOUNTER — Telehealth: Payer: Self-pay | Admitting: Neurology

## 2017-06-30 NOTE — Telephone Encounter (Signed)
Pt would like to know the status of her being approved for Botox.  Pt states Dr Lucia Gaskins was supposed to be in contact with insurance company, please call with  response

## 2017-07-01 NOTE — Telephone Encounter (Signed)
Please call [phone] extension [phone] at your earliest convenience.

## 2017-07-02 NOTE — Telephone Encounter (Signed)
I called the patient back regarding her botox injections. She did not answer so I left a VM asking her to call me back.

## 2017-07-06 NOTE — Telephone Encounter (Signed)
Patient returning your call. I advised Kimberly Simpson was out of the office today and she wanted me to send her a message.

## 2017-07-07 NOTE — Telephone Encounter (Signed)
Dr. Lucia Gaskins, were you able to complete the peer to peer for this patient or should I call her back and tell her it has been denied?

## 2017-07-07 NOTE — Telephone Encounter (Signed)
I have not been able to call. I don't have patients the next few days scheduled and I can call tomorrow thanks

## 2017-07-08 NOTE — Telephone Encounter (Signed)
I agree with nursing assessment, okay to take gabapentin and Topamax together.

## 2017-07-08 NOTE — Telephone Encounter (Signed)
Called pt back. She has no problem with taking the Topamax tablets and was informed that extended release means that it takes longer to release med into her system after she takes it. She was also informed that taking Topamax and Gabapentin is ok and that if she anymore concerns she should speak with her pharmacist. She verbalized understanding.

## 2017-07-08 NOTE — Telephone Encounter (Signed)
Thank you Dr. Lucia Gaskins! Let me know if I can do anything to help!   I called and spoke with the patient to inform her that we are in the process of working on her approval. I informed her that we have scheduled 2 peer to peers with her insurance and they failed to call both times. Patient was understanding and appreciative.   She has some questions for the nurse and would like a call back. Patient would like to know if there is a difference in the extended release tablets of topamax (she was originally given) and the tablets she has had filled at the pharmacy recently. She says she is also about to start taking Gabapentin for her carpal tunnel and wants to make sure this will no affect the topamax. Please call and advise.

## 2017-07-22 NOTE — Telephone Encounter (Signed)
Patient called and requested to have an update on her botox injections. Was the Peer to Peer completed Dr. Lucia GaskinsAhern?

## 2017-07-23 NOTE — Telephone Encounter (Signed)
I called and completed a prior authorization requested for only the migraine diagnosis. New request is pending,  [id number]  [phone].

## 2017-07-23 NOTE — Telephone Encounter (Signed)
I called the patient and told her we have resubmitted the request.

## 2017-07-23 NOTE — Telephone Encounter (Signed)
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 They dont have a botox for migraine request. 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  That is fine, the migraine is primary. Reference number [id number].

## 2017-08-11 ENCOUNTER — Encounter (INDEPENDENT_AMBULATORY_CARE_PROVIDER_SITE_OTHER): Payer: Self-pay

## 2017-08-11 ENCOUNTER — Encounter: Payer: Self-pay | Admitting: Neurology

## 2017-08-11 ENCOUNTER — Ambulatory Visit (INDEPENDENT_AMBULATORY_CARE_PROVIDER_SITE_OTHER): Payer: BC Managed Care – PPO | Admitting: Neurology

## 2017-08-11 VITALS — BP 114/74 | HR 77

## 2017-08-11 DIAGNOSIS — G43709 Chronic migraine without aura, not intractable, without status migrainosus: Secondary | ICD-10-CM

## 2017-08-11 NOTE — Progress Notes (Signed)
Botox 100 units x 2 vials LOT: [id number] EXP: 12/2019 NDC: [id number] SP  Bacteriostatic 0.9% Sodium Chloride 4 mL LOT: [id number] EXP: 01/28/2019 NDC: [id number] //BCrn

## 2017-08-14 ENCOUNTER — Telehealth: Payer: Self-pay | Admitting: Neurology

## 2017-08-14 NOTE — Telephone Encounter (Signed)
Kimberly Simpson  called from [employer] and Relayed patient is not approved for Her Botox injection until September 29 2017 . Auth for Jan . 2019 is  # [id number] Jan. 2019 - Jul 30 2018 .  But patient has apt in Nov I relayed to . Kimberly Simpson.  Kimberly Prowsehmisha is  requesting  that you call [employer] [place] and request a earlier start date for the authorization .

## 2017-08-17 NOTE — Telephone Encounter (Signed)
[facility]. I called [facility] and cancelled this order.

## 2017-08-17 NOTE — Progress Notes (Signed)

## 2017-09-02 ENCOUNTER — Encounter: Payer: Self-pay | Admitting: *Deleted

## 2017-09-02 ENCOUNTER — Encounter: Payer: Self-pay | Admitting: Neurology

## 2017-09-02 ENCOUNTER — Ambulatory Visit (INDEPENDENT_AMBULATORY_CARE_PROVIDER_SITE_OTHER): Payer: BC Managed Care – PPO | Admitting: *Deleted

## 2017-09-02 ENCOUNTER — Other Ambulatory Visit: Payer: Self-pay | Admitting: *Deleted

## 2017-09-02 VITALS — BP 108/78 | HR 84

## 2017-09-02 DIAGNOSIS — I639 Cerebral infarction, unspecified: Principal | ICD-10-CM

## 2017-09-02 DIAGNOSIS — G43611 Persistent migraine aura with cerebral infarction, intractable, with status migrainosus: Secondary | ICD-10-CM

## 2017-09-02 DIAGNOSIS — G43709 Chronic migraine without aura, not intractable, without status migrainosus: Secondary | ICD-10-CM

## 2017-09-02 MED ORDER — KETOROLAC TROMETHAMINE 60 MG/2ML IM SOLN
60.0000 mg | Freq: Once | INTRAMUSCULAR | Status: AC
  Administered 2017-09-02: 60 mg via INTRAMUSCULAR

## 2017-09-02 NOTE — Telephone Encounter (Signed)
Called patient back. She reports stabbing pain in R temporal and R back of head, light sensitivity, typical migraine for her. Will go to ED for any concerns or changes in symptoms concerning for stroke (weakness or numbness on one side of body, speech changes, vision changes, balance difficulties, etc). She is interested in receiving a Toradol injection in the office today. Will d/w Dr. Lucia GaskinsAhern and call patient back.

## 2017-09-02 NOTE — Progress Notes (Signed)
Patient arrived for nurse visit for Toradol 60 mg IM injection for headache. See previous phone encounter today. Injection given in RUOQ of buttock. Tolerated well. Bandaid applied. Offered for pt to stay awhile for rest and observation but she declined and left.

## 2017-09-02 NOTE — Telephone Encounter (Signed)
Called the patient and informed her that Dr. Lucia GaskinsAhern has ordered Toradol IM injection. The patient verbalized understanding and appreciation and will arrive around 4:15 pm today.

## 2017-11-12 ENCOUNTER — Telehealth: Payer: Self-pay | Admitting: Neurology

## 2017-11-12 NOTE — Telephone Encounter (Signed)
Pt is asking for a call she does not recall how often she is supposed to schedule her Botox injections.  Pt is asking for a call with her next scheduled injection should be

## 2017-11-13 NOTE — Telephone Encounter (Signed)
Returned pt's call. Informed pt that her last Botox appt was 11/13. Botox is usually done every 3 months, however pt was not approved for Botox this year. Informed pt that [facility] her to discuss this with her in more detail.

## 2017-11-16 NOTE — Telephone Encounter (Signed)
I called Kimberly Simpson and scheduled her next apt. It looks like from previous notes that she is approved through November of this year.

## 2017-11-26 ENCOUNTER — Ambulatory Visit (INDEPENDENT_AMBULATORY_CARE_PROVIDER_SITE_OTHER): Payer: BC Managed Care – PPO | Admitting: Neurology

## 2017-11-26 ENCOUNTER — Encounter: Payer: Self-pay | Admitting: Neurology

## 2017-11-26 VITALS — BP 95/62 | HR 82

## 2017-11-26 DIAGNOSIS — G43709 Chronic migraine without aura, not intractable, without status migrainosus: Secondary | ICD-10-CM

## 2017-11-26 DIAGNOSIS — G5603 Carpal tunnel syndrome, bilateral upper limbs: Secondary | ICD-10-CM

## 2017-11-26 NOTE — Addendum Note (Signed)
Addended by: Naomie DeanAHERN, ANTONIA B on: 11/26/2017 05:13 PM   Modules accepted: Orders

## 2017-11-26 NOTE — Addendum Note (Signed)
Addended by: Bertram SavinULBERTSON, Eiko Mcgowen L on: 11/26/2017 04:40 PM   Modules accepted: Orders

## 2017-11-26 NOTE — Progress Notes (Addendum)
  Interval history 11/26/2017: Her migraines have significantly improved with botox > 50% improvement in frequency and severity Consent Form Botulism Toxin Injection For Chronic Migraine  Botulism toxin has been approved by the Federal drug administration for treatment of chronic migraine. Botulism toxin does not cure chronic migraine and it may not be effective in some patients.  The administration of botulism toxin is accomplished by injecting a small amount of toxin into the muscles of the neck and head. Dosage must be titrated for each individual. Any benefits resulting from botulism toxin tend to wear off after 3 months with a repeat injection required if benefit is to be maintained. Injections are usually done every 3-4 months with maximum effect peak achieved by about 2 or 3 weeks. Botulism toxin is expensive and you should be sure of what costs you will incur resulting from the injection.  The side effects of botulism toxin use for chronic migraine may include:   -Transient, and usually mild, facial weakness with facial injections  -Transient, and usually mild, head or neck weakness with head/neck injections  -Reduction or loss of forehead facial animation due to forehead muscle              weakness  -Eyelid drooping  -Dry eye  -Pain at the site of injection or bruising at the site of injection  -Double vision  -Potential unknown long term risks  Contraindications: You should not have Botox if you are pregnant, nursing, allergic to albumin, have an infection, skin condition, or muscle weakness at the site of the injection, or have myasthenia gravis, Lambert-Eaton syndrome, or ALS.  It is also possible that as with any injection, there may be an allergic reaction or no effect from the medication. Reduced effectiveness after repeated injections is sometimes seen and rarely infection at the injection site may occur. All care will be taken to prevent these side effects. If therapy is  given over a long time, atrophy and wasting in the muscle injected may occur. Occasionally the patient's become refractory to treatment because they develop antibodies to the toxin. In this event, therapy needs to be modified.  I have read the above information and consent to the administration of botulism toxin.    ______________  _____   _________________  Patient signature     Date   Witness signature       BOTOX PROCEDURE NOTE FOR MIGRAINE HEADACHE    Contraindications and precautions discussed with patient(above). Aseptic procedure was observed and patient tolerated procedure. Procedure performed by Dr. Toni Janthony Holleman  The condition has existed for more than 6 months, and pt does not have a diagnosis of ALS, Myasthenia Gravis or Lambert-Eaton Syndrome. Risks and benefits of injections discussed and pt agrees to proceed with the procedure. Written consent obtained  These injections are medically necessary. He receives good benefits from these injections. These injections do not cause sedations or hallucinations which the oral therapies may cause.  Indication/Diagnosis: chronic migraine BOTOX(J0585) injection was performed according to protocol by [employer]. 200 units of BOTOX was dissolved into 4 cc NS.  NDC: [address]-01  Type of toxin: Botox  Lot #: [id number] EXP: 5/21   Description of procedure:  The patient was placed in a sitting position. The standard protocol was used for Botox as follows, with 5 units of Botox injected at each site:   -Procerus muscle, midline injection  -Corrugator muscle, bilateral injection  -Frontalis muscle, bilateral injection, with 2 sites each side, medial injection was performed   in the upper one third of the frontalis muscle, in the region vertical from the medial inferior edge of the superior orbital rim. The lateral injection was again in the upper one third of the forehead vertically above the lateral limbus of the cornea, 1.5 cm lateral  to the medial injection site.  -Temporalis muscle injection, 4 sites, bilaterally. The first injection was 3 cm above the tragus of the ear, second injection site was 1.5 cm to 3 cm up from the first injection site in line with the tragus of the ear. The third injection site was 1.5-3 cm forward between the first 2 injection sites. The fourth injection site was 1.5 cm posterior to the second injection site.  -Occipitalis muscle injection, 3 sites, bilaterally. The first injection was done one half way between the occipital protuberance and the tip of the mastoid process behind the ear. The second injection site was done lateral and superior to the first, 1 fingerbreadth from the first injection. The third injection site was 1 fingerbreadth superiorly and medially from the first injection site.  -Cervical paraspinal muscle injection, 2 sites, bilateral knee first injection site was 1 cm from the midline of the cervical spine, 3 cm inferior to the lower border of the occipital protuberance. The second injection site was 1.5 cm superiorly and laterally to the first injection site.  -Trapezius muscle injection was performed at 3 sites, bilaterally. The first injection site was in the upper trapezius muscle halfway between the inflection point of the neck, and the acromion. The second injection site was one half way between the acromion and the first injection site. The third injection was done between the first injection site and the inflection point of the neck.   Will return for repeat injection in 3 months.   A 200 unit sof Botox was used, 155 units were injected, the rest of the Botox was wasted. The patient tolerated the procedure well, there were no complications of the above procedure.   

## 2017-12-02 ENCOUNTER — Other Ambulatory Visit: Payer: Self-pay | Admitting: Neurology

## 2018-02-16 ENCOUNTER — Encounter: Payer: Self-pay | Admitting: Neurology

## 2018-02-17 NOTE — Telephone Encounter (Signed)
I called the patient again to schedule injection, per her email request. She did not answer so I left a VM asking her to call back.

## 2018-02-17 NOTE — Telephone Encounter (Signed)
Pt has returned the call to Danielle, she is asking for a call back °

## 2018-02-18 NOTE — Telephone Encounter (Signed)
I called and scheduled the patient.  °

## 2018-02-23 ENCOUNTER — Encounter: Payer: Self-pay | Admitting: Neurology

## 2018-03-02 ENCOUNTER — Telehealth: Payer: Self-pay | Admitting: Neurology

## 2018-03-02 NOTE — Telephone Encounter (Signed)
Botox authorizations [id number] [id number] (07/30/18)  I called the pharmacy to initiate a refill request. Delivery is scheduled for 03/04/18. DW  [facility] Pharm[facility] is [facility] [phone].

## 2018-03-15 ENCOUNTER — Telehealth: Payer: Self-pay | Admitting: Neurology

## 2018-03-15 NOTE — Telephone Encounter (Signed)
Pt requesting a call to r/s her botox appt  °

## 2018-03-17 NOTE — Telephone Encounter (Signed)
I returned the patients call but she did not answer so I left a VM asking her to call me back.  °

## 2018-03-23 ENCOUNTER — Ambulatory Visit: Payer: BC Managed Care – PPO | Admitting: Neurology

## 2018-04-21 ENCOUNTER — Ambulatory Visit (INDEPENDENT_AMBULATORY_CARE_PROVIDER_SITE_OTHER): Payer: BC Managed Care – PPO | Admitting: Neurology

## 2018-04-21 ENCOUNTER — Encounter: Payer: Self-pay | Admitting: Neurology

## 2018-04-21 DIAGNOSIS — G43709 Chronic migraine without aura, not intractable, without status migrainosus: Secondary | ICD-10-CM

## 2018-04-21 NOTE — Progress Notes (Signed)
Botox-100unitsx2 vials Lot: [id number] Expiration: 06/2020 NDC: 0865-7846-96: 0023-1145-01 [id number]  Dx: M01.027G43.709 Specialty Pharmacy

## 2018-04-21 NOTE — Progress Notes (Signed)
Interval history 11/26/2017: Her migraines have significantly improved with botox > 50% improvement in frequency and severity Consent Form Botulism Toxin Injection For Chronic Migraine  Botulism toxin has been approved by the Federal drug administration for treatment of chronic migraine. Botulism toxin does not cure chronic migraine and it may not be effective in some patients.  The administration of botulism toxin is accomplished by injecting a small amount of toxin into the muscles of the neck and head. Dosage must be titrated for each individual. Any benefits resulting from botulism toxin tend to wear off after 3 months with a repeat injection required if benefit is to be maintained. Injections are usually done every 3-4 months with maximum effect peak achieved by about 2 or 3 weeks. Botulism toxin is expensive and you should be sure of what costs you will incur resulting from the injection.  The side effects of botulism toxin use for chronic migraine may include:   -Transient, and usually mild, facial weakness with facial injections  -Transient, and usually mild, head or neck weakness with head/neck injections  -Reduction or loss of forehead facial animation due to forehead muscle              weakness  -Eyelid drooping  -Dry eye  -Pain at the site of injection or bruising at the site of injection  -Double vision  -Potential unknown long term risks  Contraindications: You should not have Botox if you are pregnant, nursing, allergic to albumin, have an infection, skin condition, or muscle weakness at the site of the injection, or have myasthenia gravis, Lambert-Eaton syndrome, or ALS.  It is also possible that as with any injection, there may be an allergic reaction or no effect from the medication. Reduced effectiveness after repeated injections is sometimes seen and rarely infection at the injection site may occur. All care will be taken to prevent these side effects. If therapy is  given over a long time, atrophy and wasting in the muscle injected may occur. Occasionally the patient's become refractory to treatment because they develop antibodies to the toxin. In this event, therapy needs to be modified.  I have read the above information and consent to the administration of botulism toxin.    ______________  _____   _________________  Patient signature     Date   Witness signature       BOTOX PROCEDURE NOTE FOR MIGRAINE HEADACHE    Contraindications and precautions discussed with patient(above). Aseptic procedure was observed and patient tolerated procedure. Procedure performed by Dr. Artemio Alyoni Baruch Lewers  The condition has existed for more than 6 months, and pt does not have a diagnosis of ALS, Myasthenia Gravis or Lambert-Eaton Syndrome. Risks and benefits of injections discussed and pt agrees to proceed with the procedure. Written consent obtained  These injections are medically necessary. He receives good benefits from these injections. These injections do not cause sedations or hallucinations which the oral therapies may cause.  Indication/Diagnosis: chronic migraine BOTOX(J0585) injection was performed according to protocol by [employer]. 200 units of BOTOX was dissolved into 4 cc NS.  NDC: [address]-4000023-1145-01  Type of toxin: Botox  Lot #: [id number] EXP: 5/21   Description of procedure:  The patient was placed in a sitting position. The standard protocol was used for Botox as follows, with 5 units of Botox injected at each site:   -Procerus muscle, midline injection  -Corrugator muscle, bilateral injection  -Frontalis muscle, bilateral injection, with 2 sites each side, medial injection was performed  in the upper one third of the frontalis muscle, in the region vertical from the medial inferior edge of the superior orbital rim. The lateral injection was again in the upper one third of the forehead vertically above the lateral limbus of the cornea, 1.5 cm lateral  to the medial injection site.  -Temporalis muscle injection, 4 sites, bilaterally. The first injection was 3 cm above the tragus of the ear, second injection site was 1.5 cm to 3 cm up from the first injection site in line with the tragus of the ear. The third injection site was 1.5-3 cm forward between the first 2 injection sites. The fourth injection site was 1.5 cm posterior to the second injection site.  -Occipitalis muscle injection, 3 sites, bilaterally. The first injection was done one half way between the occipital protuberance and the tip of the mastoid process behind the ear. The second injection site was done lateral and superior to the first, 1 fingerbreadth from the first injection. The third injection site was 1 fingerbreadth superiorly and medially from the first injection site.  -Cervical paraspinal muscle injection, 2 sites, bilateral knee first injection site was 1 cm from the midline of the cervical spine, 3 cm inferior to the lower border of the occipital protuberance. The second injection site was 1.5 cm superiorly and laterally to the first injection site.  -Trapezius muscle injection was performed at 3 sites, bilaterally. The first injection site was in the upper trapezius muscle halfway between the inflection point of the neck, and the acromion. The second injection site was one half way between the acromion and the first injection site. The third injection was done between the first injection site and the inflection point of the neck.   Will return for repeat injection in 3 months.   A 200 unit sof Botox was used, 155 units were injected, the rest of the Botox was wasted. The patient tolerated the procedure well, there were no complications of the above procedure.

## 2018-05-31 ENCOUNTER — Other Ambulatory Visit: Payer: Self-pay | Admitting: Neurology

## 2018-07-19 ENCOUNTER — Telehealth: Payer: Self-pay | Admitting: Neurology

## 2018-07-19 MED ORDER — ONABOTULINUMTOXINA 100 UNITS IJ SOLR: INTRAMUSCULAR | 3 refills | Status: DC

## 2018-07-19 NOTE — Telephone Encounter (Signed)
Done, prescription sent to [facility] [place].

## 2018-07-19 NOTE — Telephone Encounter (Signed)
Toma Copier could you please send a new script to [facility]?

## 2018-07-20 NOTE — Telephone Encounter (Signed)
Noted, thank you

## 2018-07-26 ENCOUNTER — Encounter: Payer: Self-pay | Admitting: Neurology

## 2018-07-26 ENCOUNTER — Ambulatory Visit (INDEPENDENT_AMBULATORY_CARE_PROVIDER_SITE_OTHER): Payer: BC Managed Care – PPO | Admitting: Neurology

## 2018-07-26 VITALS — BP 118/62 | HR 80 | Ht 64.0 in | Wt 181.0 lb

## 2018-07-26 DIAGNOSIS — G43709 Chronic migraine without aura, not intractable, without status migrainosus: Secondary | ICD-10-CM

## 2018-07-26 NOTE — Progress Notes (Signed)
Consent Form Botulism Toxin Injection For Chronic Migraine  Interval history 11/26/2017: Her migraines have significantly improved with botox > 50% improvement in frequency and severity. Her baseline is daily headaches with 8 migraine days a month now down to 2-3 headache or migraine days a month. Gave samples of Emgality for the next 3 months, see how she does at next appt.   +Masseters, +temples,+orb occuli, +LS  Reviewed orally with patient, additionally signature is on file:  Botulism toxin has been approved by the Federal drug administration for treatment of chronic migraine. Botulism toxin does not cure chronic migraine and it may not be effective in some patients.  The administration of botulism toxin is accomplished by injecting a small amount of toxin into the muscles of the neck and head. Dosage must be titrated for each individual. Any benefits resulting from botulism toxin tend to wear off after 3 months with a repeat injection required if benefit is to be maintained. Injections are usually done every 3-4 months with maximum effect peak achieved by about 2 or 3 weeks. Botulism toxin is expensive and you should be sure of what costs you will incur resulting from the injection.  The side effects of botulism toxin use for chronic migraine may include:   -Transient, and usually mild, facial weakness with facial injections  -Transient, and usually mild, head or neck weakness with head/neck injections  -Reduction or loss of forehead facial animation due to forehead muscle weakness  -Eyelid drooping  -Dry eye  -Pain at the site of injection or bruising at the site of injection  -Double vision  -Potential unknown long term risks  Contraindications: You should not have Botox if you are pregnant, nursing, allergic to albumin, have an infection, skin condition, or muscle weakness at the site of the injection, or have myasthenia gravis, Lambert-Eaton syndrome, or ALS.  It is also  possible that as with any injection, there may be an allergic reaction or no effect from the medication. Reduced effectiveness after repeated injections is sometimes seen and rarely infection at the injection site may occur. All care will be taken to prevent these side effects. If therapy is given over a long time, atrophy and wasting in the muscle injected may occur. Occasionally the patient's become refractory to treatment because they develop antibodies to the toxin. In this event, therapy needs to be modified.  I have read the above information and consent to the administration of botulism toxin.    BOTOX PROCEDURE NOTE FOR MIGRAINE HEADACHE    Contraindications and precautions discussed with patient(above). Aseptic procedure was observed and patient tolerated procedure. Procedure performed by Dr. Artemio Aly  The condition has existed for more than 6 months, and pt does not have a diagnosis of ALS, Myasthenia Gravis or Lambert-Eaton Syndrome.  Risks and benefits of injections discussed and pt agrees to proceed with the procedure.  Written consent obtained  These injections are medically necessary. Pt  receives good benefits from these injections. These injections do not cause sedations or hallucinations which the oral therapies may cause.  Description of procedure:  The patient was placed in a sitting position. The standard protocol was used for Botox as follows, with 5 units of Botox injected at each site:   -Procerus muscle, midline injection  -Corrugator muscle, bilateral injection  -Frontalis muscle, bilateral injection, with 2 sites each side, medial injection was performed in the upper one third of the frontalis muscle, in the region vertical from the medial inferior edge of  the superior orbital rim. The lateral injection was again in the upper one third of the forehead vertically above the lateral limbus of the cornea, 1.5 cm lateral to the medial injection site.  - Levator  Scapulae: 5 units bilaterally  -Temporalis muscle injection, 5 sites, bilaterally. The first injection was 3 cm above the tragus of the ear, second injection site was 1.5 cm to 3 cm up from the first injection site in line with the tragus of the ear. The third injection site was 1.5-3 cm forward between the first 2 injection sites. The fourth injection site was 1.5 cm posterior to the second injection site. 5th site laterally in the temporalis  muscleat the level of the outer canthus.  - Patient feels her clenching is a trigger for headaches. +5 units masseter bilaterally   - Patient feels the migraines are centered around the eyes +5 units bilaterally at the outer canthus in the orbicularis occuli  -Occipitalis muscle injection, 3 sites, bilaterally. The first injection was done one half way between the occipital protuberance and the tip of the mastoid process behind the ear. The second injection site was done lateral and superior to the first, 1 fingerbreadth from the first injection. The third injection site was 1 fingerbreadth superiorly and medially from the first injection site.  -Cervical paraspinal muscle injection, 2 sites, bilateral knee first injection site was 1 cm from the midline of the cervical spine, 3 cm inferior to the lower border of the occipital protuberance. The second injection site was 1.5 cm superiorly and laterally to the first injection site.  -Trapezius muscle injection was performed at 3 sites, bilaterally. The first injection site was in the upper trapezius muscle halfway between the inflection point of the neck, and the acromion. The second injection site was one half way between the acromion and the first injection site. The third injection was done between the first injection site and the inflection point of the neck.   Will return for repeat injection in 3 months.   A 200 unit sof Botox was used, any Botox not injected was wasted. The patient tolerated the  procedure well, there were no complications of the above procedure.

## 2018-07-26 NOTE — Progress Notes (Signed)
Botox- 100 units x 2 vials Lot: [id number] Expiration: 06/2020 NDC: 0023-1145-01  Bacteriostatic 0.9% Sodium Chloride- 4mL total Lot: [id number] Expiration: 06/30/2019 NDC: 0409-1966-02  Dx: G43.709 S/P  

## 2018-08-03 ENCOUNTER — Other Ambulatory Visit: Payer: Self-pay | Admitting: Neurology

## 2018-08-03 ENCOUNTER — Encounter (HOSPITAL_COMMUNITY): Payer: Self-pay

## 2018-08-03 MED ORDER — ONDANSETRON 4 MG PO TBDP: 4.0000 mg | ORAL_TABLET | Freq: Three times a day (TID) | ORAL | 3 refills | Status: DC | PRN

## 2018-10-18 ENCOUNTER — Telehealth: Payer: Self-pay | Admitting: Neurology

## 2018-10-18 NOTE — Telephone Encounter (Signed)
I called [facility] to complete PA for the patients Botox.

## 2018-10-19 ENCOUNTER — Telehealth: Payer: Self-pay | Admitting: Neurology

## 2018-10-19 NOTE — Telephone Encounter (Signed)
Patient would like to know if she should continue Emgalty, she says she is not sure it is working and just wants to make Dr. Lucia Gaskins, this can be discussed at Botox apt next week.

## 2018-10-20 ENCOUNTER — Ambulatory Visit: Payer: BC Managed Care – PPO | Admitting: Neurology

## 2018-10-27 ENCOUNTER — Ambulatory Visit: Payer: BC Managed Care – PPO | Admitting: Neurology

## 2018-10-28 ENCOUNTER — Ambulatory Visit (INDEPENDENT_AMBULATORY_CARE_PROVIDER_SITE_OTHER): Payer: BC Managed Care – PPO | Admitting: Neurology

## 2018-10-28 ENCOUNTER — Telehealth: Payer: Self-pay | Admitting: Neurology

## 2018-10-28 DIAGNOSIS — G43709 Chronic migraine without aura, not intractable, without status migrainosus: Secondary | ICD-10-CM | POA: Diagnosis not present

## 2018-10-28 NOTE — Telephone Encounter (Signed)
I called the patient back and left a VM stating check in for 3:45. DW

## 2018-10-28 NOTE — Telephone Encounter (Signed)
Pt is asking for a call to confirm it is okay for her to check in around 3:45 for her Botox appointment this afternoon

## 2018-10-28 NOTE — Progress Notes (Signed)
Botox- 100 units x 2 vials Lot: [id number] Expiration: 02/2021 NDC: 0023-1145-01  Bacteriostatic 0.9% Sodium Chloride- 4mL total Lot: [id number] Expiration: 06/30/2019 NDC: 0409-1966-02  Dx: G43.709 B/B   

## 2018-10-28 NOTE — Telephone Encounter (Signed)
12 wk Botox inj °

## 2018-10-28 NOTE — Progress Notes (Signed)
Consent Form Botulism Toxin Injection For Chronic Migraine  Interval history 11/26/2017: This our 4th boox session. Her migraines have significantly improved with botox > 50% improvement in frequency and severity. Her baseline is daily headaches with 8 migraine days a month now down to 2-3 headache or migraine days a month. Gave samples of Emgality for the next 3 months, see how she does at next appt.   +Masseters, +temples,+orb occuli, +LS  Reviewed orally with patient, additionally signature is on file:  Botulism toxin has been approved by the Federal drug administration for treatment of chronic migraine. Botulism toxin does not cure chronic migraine and it may not be effective in some patients.  The administration of botulism toxin is accomplished by injecting a small amount of toxin into the muscles of the neck and head. Dosage must be titrated for each individual. Any benefits resulting from botulism toxin tend to wear off after 3 months with a repeat injection required if benefit is to be maintained. Injections are usually done every 3-4 months with maximum effect peak achieved by about 2 or 3 weeks. Botulism toxin is expensive and you should be sure of what costs you will incur resulting from the injection.  The side effects of botulism toxin use for chronic migraine may include:   -Transient, and usually mild, facial weakness with facial injections  -Transient, and usually mild, head or neck weakness with head/neck injections  -Reduction or loss of forehead facial animation due to forehead muscle weakness  -Eyelid drooping  -Dry eye  -Pain at the site of injection or bruising at the site of injection  -Double vision  -Potential unknown long term risks  Contraindications: You should not have Botox if you are pregnant, nursing, allergic to albumin, have an infection, skin condition, or muscle weakness at the site of the injection, or have myasthenia gravis, Lambert-Eaton syndrome, or  ALS.  It is also possible that as with any injection, there may be an allergic reaction or no effect from the medication. Reduced effectiveness after repeated injections is sometimes seen and rarely infection at the injection site may occur. All care will be taken to prevent these side effects. If therapy is given over a long time, atrophy and wasting in the muscle injected may occur. Occasionally the patient's become refractory to treatment because they develop antibodies to the toxin. In this event, therapy needs to be modified.  I have read the above information and consent to the administration of botulism toxin.    BOTOX PROCEDURE NOTE FOR MIGRAINE HEADACHE    Contraindications and precautions discussed with patient(above). Aseptic procedure was observed and patient tolerated procedure. Procedure performed by Dr. Artemio Aly  The condition has existed for more than 6 months, and pt does not have a diagnosis of ALS, Myasthenia Gravis or Lambert-Eaton Syndrome.  Risks and benefits of injections discussed and pt agrees to proceed with the procedure.  Written consent obtained  These injections are medically necessary. Pt  receives good benefits from these injections. These injections do not cause sedations or hallucinations which the oral therapies may cause.  Description of procedure:  The patient was placed in a sitting position. The standard protocol was used for Botox as follows, with 5 units of Botox injected at each site:   -Procerus muscle, midline injection  -Corrugator muscle, bilateral injection  -Frontalis muscle, bilateral injection, with 2 sites each side, medial injection was performed in the upper one third of the frontalis muscle, in the region vertical from  the medial inferior edge of the superior orbital rim. The lateral injection was again in the upper one third of the forehead vertically above the lateral limbus of the cornea, 1.5 cm lateral to the medial injection  site.  - Levator Scapulae: 5 units bilaterally  -Temporalis muscle injection, 5 sites, bilaterally. The first injection was 3 cm above the tragus of the ear, second injection site was 1.5 cm to 3 cm up from the first injection site in line with the tragus of the ear. The third injection site was 1.5-3 cm forward between the first 2 injection sites. The fourth injection site was 1.5 cm posterior to the second injection site. 5th site laterally in the temporalis  muscleat the level of the outer canthus.  - Patient feels her clenching is a trigger for headaches. +5 units masseter bilaterally   - Patient feels the migraines are centered around the eyes +5 units bilaterally at the outer canthus in the orbicularis occuli  -Occipitalis muscle injection, 3 sites, bilaterally. The first injection was done one half way between the occipital protuberance and the tip of the mastoid process behind the ear. The second injection site was done lateral and superior to the first, 1 fingerbreadth from the first injection. The third injection site was 1 fingerbreadth superiorly and medially from the first injection site.  -Cervical paraspinal muscle injection, 2 sites, bilateral knee first injection site was 1 cm from the midline of the cervical spine, 3 cm inferior to the lower border of the occipital protuberance. The second injection site was 1.5 cm superiorly and laterally to the first injection site.  -Trapezius muscle injection was performed at 3 sites, bilaterally. The first injection site was in the upper trapezius muscle halfway between the inflection point of the neck, and the acromion. The second injection site was one half way between the acromion and the first injection site. The third injection was done between the first injection site and the inflection point of the neck.   Will return for repeat injection in 3 months.   A 200 unit sof Botox was used, any Botox not injected was wasted. The patient  tolerated the procedure well, there were no complications of the above procedure.

## 2018-11-25 ENCOUNTER — Other Ambulatory Visit: Payer: Self-pay | Admitting: Occupational Medicine

## 2018-11-25 ENCOUNTER — Ambulatory Visit: Payer: Self-pay

## 2018-11-25 DIAGNOSIS — M79642 Pain in left hand: Secondary | ICD-10-CM

## 2018-12-01 ENCOUNTER — Other Ambulatory Visit: Payer: Self-pay | Admitting: Neurology

## 2019-02-02 ENCOUNTER — Ambulatory Visit: Payer: BC Managed Care – PPO | Admitting: Neurology

## 2019-03-14 ENCOUNTER — Ambulatory Visit (INDEPENDENT_AMBULATORY_CARE_PROVIDER_SITE_OTHER): Payer: BC Managed Care – PPO | Admitting: Neurology

## 2019-03-14 ENCOUNTER — Other Ambulatory Visit: Payer: Self-pay

## 2019-03-14 VITALS — Temp 97.8°F

## 2019-03-14 DIAGNOSIS — G43709 Chronic migraine without aura, not intractable, without status migrainosus: Secondary | ICD-10-CM

## 2019-03-14 MED ORDER — NURTEC 75 MG PO TBDP: 75.0000 mg | ORAL_TABLET | Freq: Every day | ORAL | 0 refills | Status: DC | PRN

## 2019-03-14 NOTE — Progress Notes (Signed)
Consent Form Botulism Toxin Injection For Chronic Migraine  Interval history 03/14/2019: Her migraines have significantly improved with botox > 50% improvement in frequency and severity. Her baseline is daily headaches with 8 migraine days a month now down to 2-3 headache or migraine days a month. Gave samples of Emgality Did not significantly help. Marland Kitchen   +Masseters, +temples,+orb occuli, +LS  Reviewed orally with patient, additionally signature is on file:  Botulism toxin has been approved by the Federal drug administration for treatment of chronic migraine. Botulism toxin does not cure chronic migraine and it may not be effective in some patients.  The administration of botulism toxin is accomplished by injecting a small amount of toxin into the muscles of the neck and head. Dosage must be titrated for each individual. Any benefits resulting from botulism toxin tend to wear off after 3 months with a repeat injection required if benefit is to be maintained. Injections are usually done every 3-4 months with maximum effect peak achieved by about 2 or 3 weeks. Botulism toxin is expensive and you should be sure of what costs you will incur resulting from the injection.  The side effects of botulism toxin use for chronic migraine may include:   -Transient, and usually mild, facial weakness with facial injections  -Transient, and usually mild, head or neck weakness with head/neck injections  -Reduction or loss of forehead facial animation due to forehead muscle weakness  -Eyelid drooping  -Dry eye  -Pain at the site of injection or bruising at the site of injection  -Double vision  -Potential unknown long term risks  Contraindications: You should not have Botox if you are pregnant, nursing, allergic to albumin, have an infection, skin condition, or muscle weakness at the site of the injection, or have myasthenia gravis, Lambert-Eaton syndrome, or ALS.  It is also possible that as with any  injection, there may be an allergic reaction or no effect from the medication. Reduced effectiveness after repeated injections is sometimes seen and rarely infection at the injection site may occur. All care will be taken to prevent these side effects. If therapy is given over a long time, atrophy and wasting in the muscle injected may occur. Occasionally the patient's become refractory to treatment because they develop antibodies to the toxin. In this event, therapy needs to be modified.  I have read the above information and consent to the administration of botulism toxin.    BOTOX PROCEDURE NOTE FOR MIGRAINE HEADACHE    Contraindications and precautions discussed with patient(above). Aseptic procedure was observed and patient tolerated procedure. Procedure performed by Dr. Georgia Dom  The condition has existed for more than 6 months, and pt does not have a diagnosis of ALS, Myasthenia Gravis or Lambert-Eaton Syndrome.  Risks and benefits of injections discussed and pt agrees to proceed with the procedure.  Written consent obtained  These injections are medically necessary. Pt  receives good benefits from these injections. These injections do not cause sedations or hallucinations which the oral therapies may cause.  Description of procedure:  The patient was placed in a sitting position. The standard protocol was used for Botox as follows, with 5 units of Botox injected at each site:   -Procerus muscle, midline injection  -Corrugator muscle, bilateral injection  -Frontalis muscle, bilateral injection, with 2 sites each side, medial injection was performed in the upper one third of the frontalis muscle, in the region vertical from the medial inferior edge of the superior orbital rim. The lateral injection  was again in the upper one third of the forehead vertically above the lateral limbus of the cornea, 1.5 cm lateral to the medial injection site.  - Levator Scapulae: 5 units bilaterally   -Temporalis muscle injection, 5 sites, bilaterally. The first injection was 3 cm above the tragus of the ear, second injection site was 1.5 cm to 3 cm up from the first injection site in line with the tragus of the ear. The third injection site was 1.5-3 cm forward between the first 2 injection sites. The fourth injection site was 1.5 cm posterior to the second injection site. 5th site laterally in the temporalis  muscleat the level of the outer canthus.  - Patient feels her clenching is a trigger for headaches. +5 units masseter bilaterally   - Patient feels the migraines are centered around the eyes +5 units bilaterally at the outer canthus in the orbicularis occuli  -Occipitalis muscle injection, 3 sites, bilaterally. The first injection was done one half way between the occipital protuberance and the tip of the mastoid process behind the ear. The second injection site was done lateral and superior to the first, 1 fingerbreadth from the first injection. The third injection site was 1 fingerbreadth superiorly and medially from the first injection site.  -Cervical paraspinal muscle injection, 2 sites, bilateral knee first injection site was 1 cm from the midline of the cervical spine, 3 cm inferior to the lower border of the occipital protuberance. The second injection site was 1.5 cm superiorly and laterally to the first injection site.  -Trapezius muscle injection was performed at 3 sites, bilaterally. The first injection site was in the upper trapezius muscle halfway between the inflection point of the neck, and the acromion. The second injection site was one half way between the acromion and the first injection site. The third injection was done between the first injection site and the inflection point of the neck.   Will return for repeat injection in 3 months.   A 200 unit sof Botox was used, any Botox not injected was wasted. The patient tolerated the procedure well, there were no  complications of the above procedure.

## 2019-03-14 NOTE — Progress Notes (Signed)
Botox- 100 units x 2 vials Lot: [id number] Expiration: 10/2021 NDC: 6168-3729-02  Bacteriostatic 0.9% Sodium Chloride- 82mL total Lot: [id number] Expiration: 06/30/2019 NDC: 0802-2336-12  Dx: A44.975 B/B

## 2019-05-21 IMAGING — DX DG HAND COMPLETE 3+V*L*
3 series · 3 of 3 positions shown · non-contrast
Comparison: None.

CLINICAL DATA: Fell today; pain MC area left and

EXAM:
LEFT HAND - COMPLETE 3+ VIEW

[hand pa]
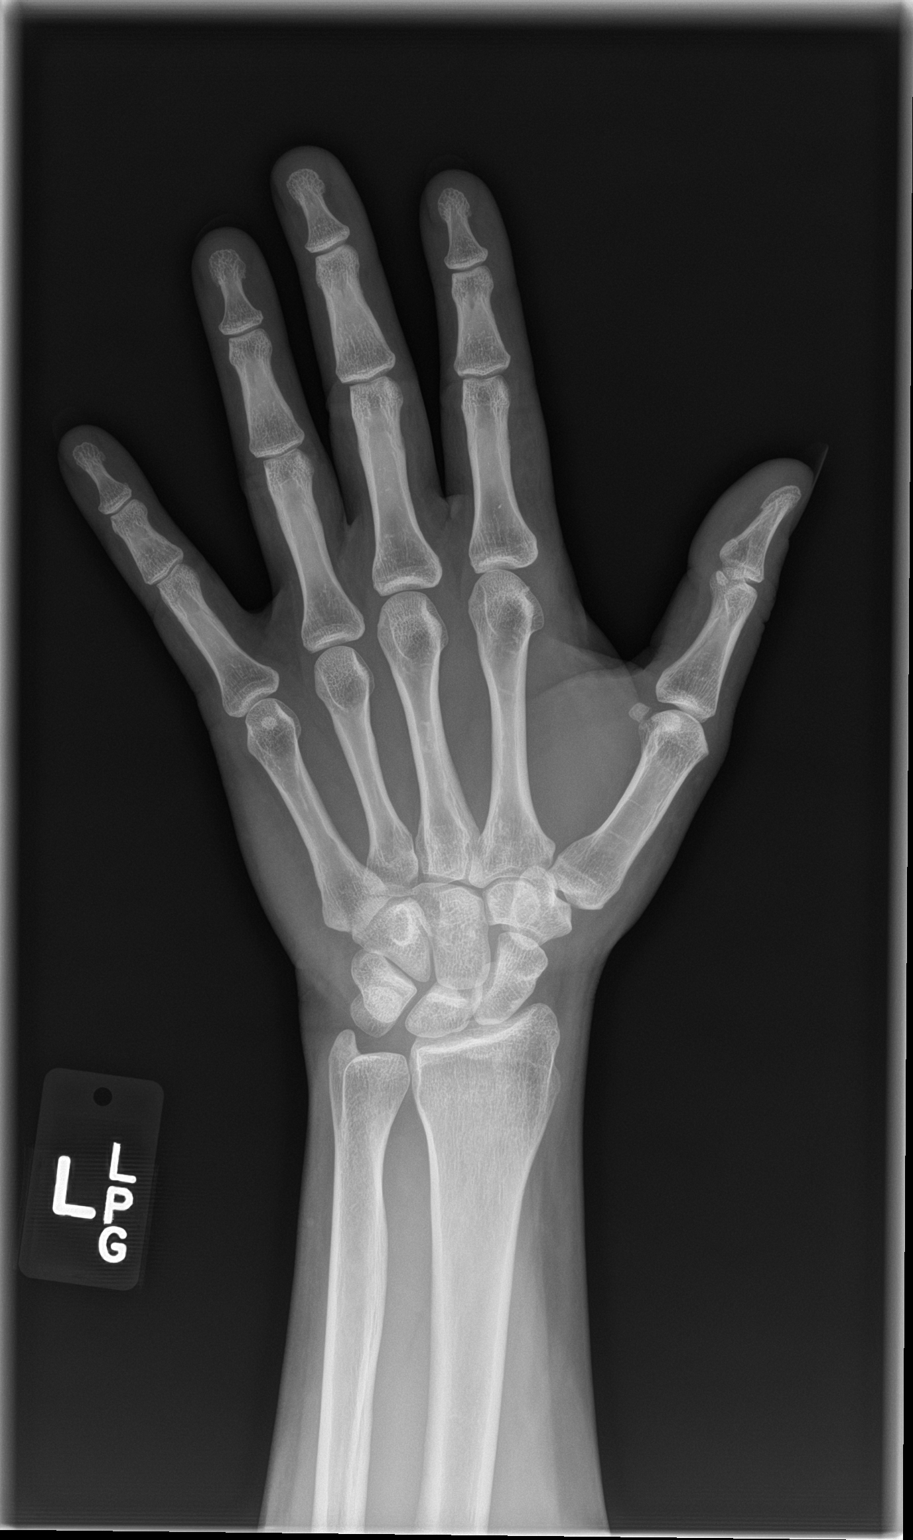

[hand obl]
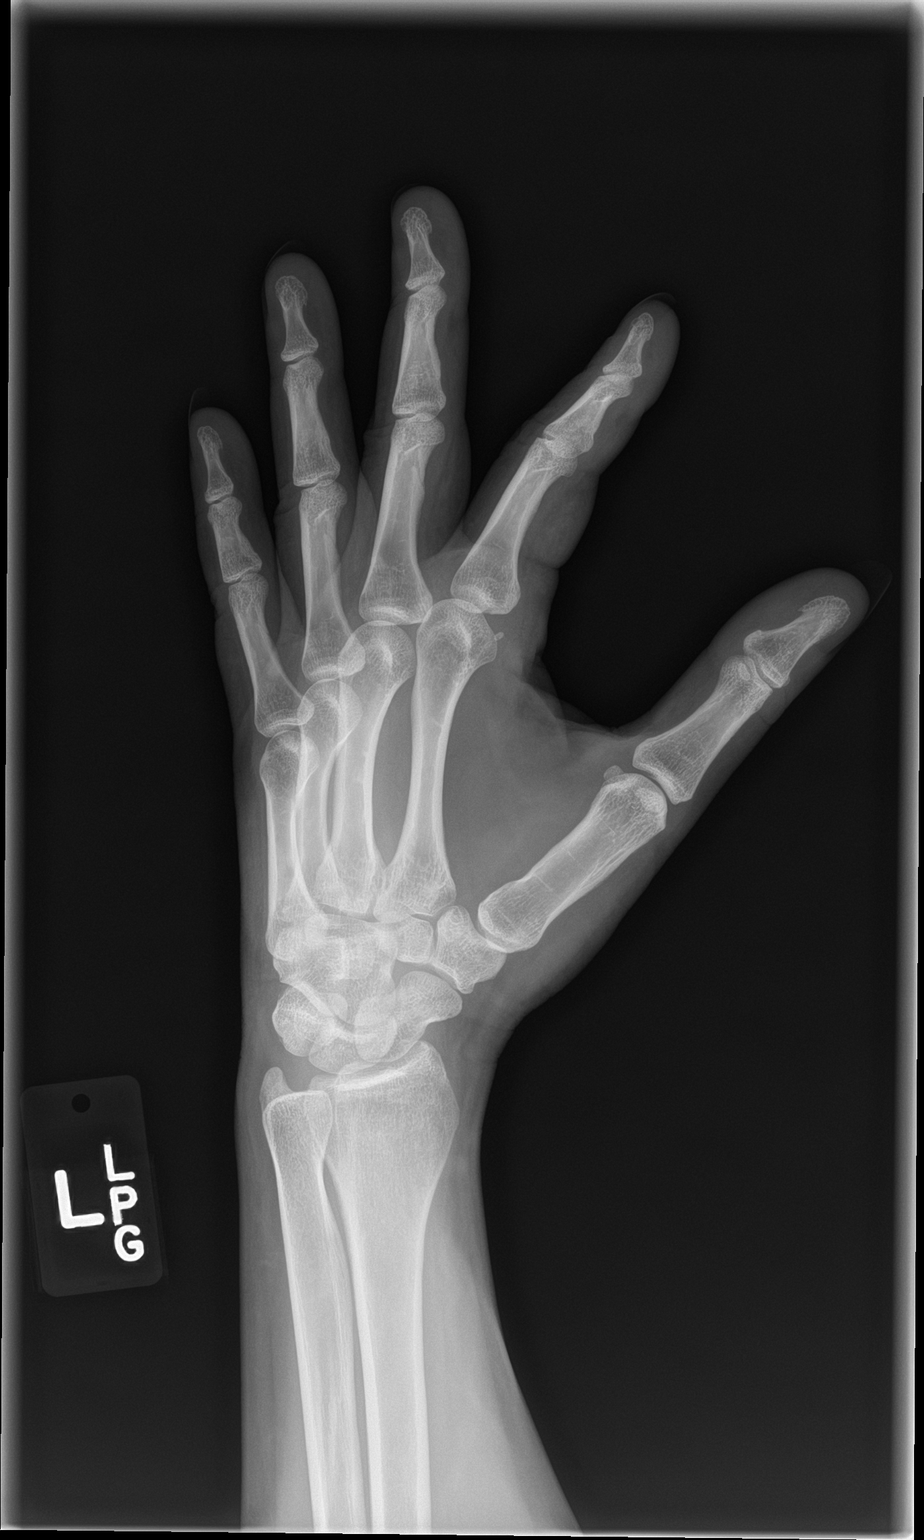

[hand lat]
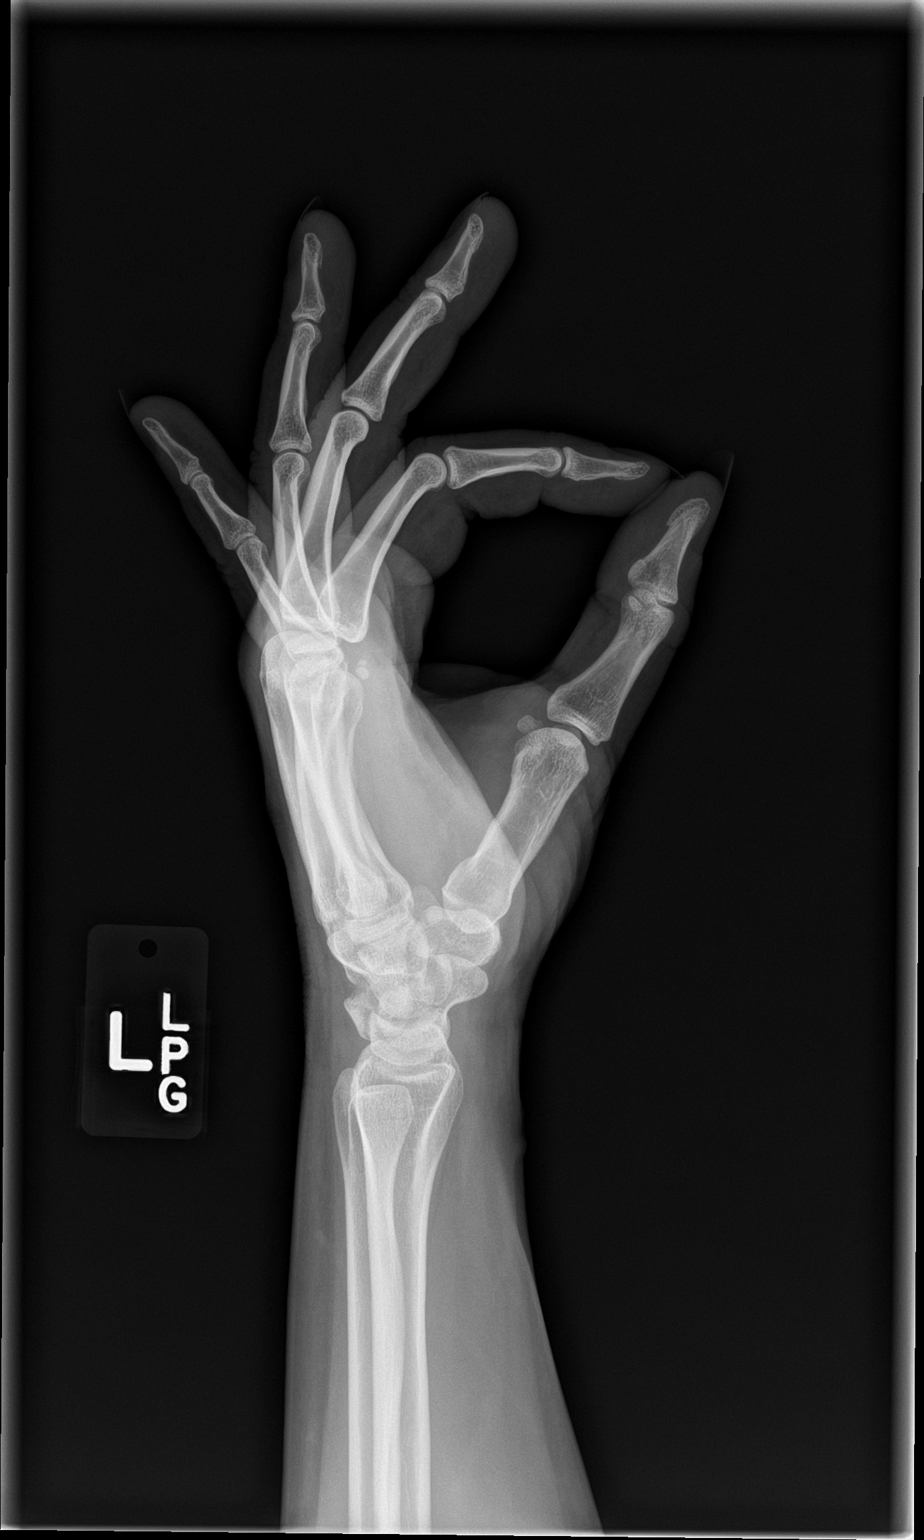

[3 of 3 positions shown; findings below may reference images not displayed]

FINDINGS: There is no evidence of fracture or dislocation. There is no
evidence of arthropathy or other focal bone abnormality. Soft
tissues are unremarkable.
IMPRESSION: Negative.

## 2019-06-14 ENCOUNTER — Ambulatory Visit: Payer: BC Managed Care – PPO | Admitting: Neurology

## 2019-06-23 ENCOUNTER — Other Ambulatory Visit: Payer: Self-pay

## 2019-06-23 ENCOUNTER — Telehealth: Payer: Self-pay

## 2019-06-23 ENCOUNTER — Ambulatory Visit (INDEPENDENT_AMBULATORY_CARE_PROVIDER_SITE_OTHER): Payer: BC Managed Care – PPO | Admitting: Neurology

## 2019-06-23 VITALS — Temp 98.1°F

## 2019-06-23 DIAGNOSIS — G43709 Chronic migraine without aura, not intractable, without status migrainosus: Secondary | ICD-10-CM | POA: Diagnosis not present

## 2019-06-23 MED ORDER — KETOROLAC TROMETHAMINE 60 MG/2ML IM SOLN
60.0000 mg | Freq: Once | INTRAMUSCULAR | Status: AC
  Administered 2019-06-23: 60 mg via INTRAMUSCULAR

## 2019-06-23 NOTE — Progress Notes (Signed)
Botox- 200 units x 1 vial Lot: [id number] Expiration: 01/2022 NDC: 8875-7972-82  Bacteriostatic 0.9% Sodium Chloride- 59mL total Lot: [id number] Expiration: 06/30/2019 NDC: 1537-9432-76  Dx: D47.092 B/B

## 2019-06-23 NOTE — Telephone Encounter (Signed)
Washed hands. Put on gloves.  Cleaned the the single dose vial with a alcohol  Prep wipe. Cleaned the left upper glute with a alcohol prep wipe.   Administration Toradol 60 mg given in the Left upper Glute, pt tolerated well, bandaid applied.

## 2019-06-23 NOTE — Progress Notes (Signed)
Consent Form Botulism Toxin Injection For Chronic Migraine  Interval history 03/14/2019: Her migraines have significantly improved with botox > 50% improvement in frequency and severity. Her baseline is daily headaches with 8 migraine days a month now down to 2-3 headache or migraine days a month. Gave samples of Emgality Did not significantly help. +a.  Reviewed orally with patient, additionally signature is on file:  Botulism toxin has been approved by the Federal drug administration for treatment of chronic migraine. Botulism toxin does not cure chronic migraine and it may not be effective in some patients.  The administration of botulism toxin is accomplished by injecting a small amount of toxin into the muscles of the neck and head. Dosage must be titrated for each individual. Any benefits resulting from botulism toxin tend to wear off after 3 months with a repeat injection required if benefit is to be maintained. Injections are usually done every 3-4 months with maximum effect peak achieved by about 2 or 3 weeks. Botulism toxin is expensive and you should be sure of what costs you will incur resulting from the injection.  The side effects of botulism toxin use for chronic migraine may include:   -Transient, and usually mild, facial weakness with facial injections  -Transient, and usually mild, head or neck weakness with head/neck injections  -Reduction or loss of forehead facial animation due to forehead muscle weakness  -Eyelid drooping  -Dry eye  -Pain at the site of injection or bruising at the site of injection  -Double vision  -Potential unknown long term risks  Contraindications: You should not have Botox if you are pregnant, nursing, allergic to albumin, have an infection, skin condition, or muscle weakness at the site of the injection, or have myasthenia gravis, Lambert-Eaton syndrome, or ALS.  It is also possible that as with any injection, there may be an allergic reaction  or no effect from the medication. Reduced effectiveness after repeated injections is sometimes seen and rarely infection at the injection site may occur. All care will be taken to prevent these side effects. If therapy is given over a long time, atrophy and wasting in the muscle injected may occur. Occasionally the patient's become refractory to treatment because they develop antibodies to the toxin. In this event, therapy needs to be modified.  I have read the above information and consent to the administration of botulism toxin.    BOTOX PROCEDURE NOTE FOR MIGRAINE HEADACHE    Contraindications and precautions discussed with patient(above). Aseptic procedure was observed and patient tolerated procedure. Procedure performed by Dr. Georgia Dom  The condition has existed for more than 6 months, and pt does not have a diagnosis of ALS, Myasthenia Gravis or Lambert-Eaton Syndrome.  Risks and benefits of injections discussed and pt agrees to proceed with the procedure.  Written consent obtained  These injections are medically necessary. Pt  receives good benefits from these injections. These injections do not cause sedations or hallucinations which the oral therapies may cause.  Description of procedure:  The patient was placed in a sitting position. The standard protocol was used for Botox as follows, with 5 units of Botox injected at each site:   -Procerus muscle, midline injection  -Corrugator muscle, bilateral injection  -Frontalis muscle, bilateral injection, with 2 sites each side, medial injection was performed in the upper one third of the frontalis muscle, in the region vertical from the medial inferior edge of the superior orbital rim. The lateral injection was again in the upper one  third of the forehead vertically above the lateral limbus of the cornea, 1.5 cm lateral to the medial injection site.  - Levator Scapulae: 5 units bilaterally  -Temporalis muscle injection, 4 sites,  bilaterally. The first injection was 3 cm above the tragus of the ear, second injection site was 1.5 cm to 3 cm up from the first injection site in line with the tragus of the ear. The third injection site was 1.5-3 cm forward between the first 2 injection sites.   -Occipitalis muscle injection, 3 sites, bilaterally. The first injection was done one half way between the occipital protuberance and the tip of the mastoid process behind the ear. The second injection site was done lateral and superior to the first, 1 fingerbreadth from the first injection. The third injection site was 1 fingerbreadth superiorly and medially from the first injection site.  -Cervical paraspinal muscle injection, 2 sites, bilateral knee first injection site was 1 cm from the midline of the cervical spine, 3 cm inferior to the lower border of the occipital protuberance. The second injection site was 1.5 cm superiorly and laterally to the first injection site.  -Trapezius muscle injection was performed at 3 sites, bilaterally. The first injection site was in the upper trapezius muscle halfway between the inflection point of the neck, and the acromion. The second injection site was one half way between the acromion and the first injection site. The third injection was done between the first injection site and the inflection point of the neck.   Will return for repeat injection in 3 months.   A 200 unit sof Botox was used, any Botox not injected was wasted. The patient tolerated the procedure well, there were no complications of the above procedure.

## 2019-06-28 ENCOUNTER — Telehealth: Payer: Self-pay

## 2019-06-28 NOTE — Telephone Encounter (Signed)
I called to schedule the patient but they did not answer so I left a VM asking them to call me back.  ° °

## 2019-07-10 ENCOUNTER — Other Ambulatory Visit: Payer: Self-pay | Admitting: Neurology

## 2019-08-10 ENCOUNTER — Other Ambulatory Visit: Payer: Self-pay | Admitting: Neurology

## 2019-08-11 ENCOUNTER — Encounter: Payer: Self-pay | Admitting: *Deleted

## 2019-08-11 NOTE — Telephone Encounter (Signed)
I messaged pt in mychart to clarify her dose. Previously she was advised to decrease to 50 mg.

## 2019-08-12 ENCOUNTER — Other Ambulatory Visit: Payer: Self-pay

## 2019-08-15 NOTE — Telephone Encounter (Signed)
Pt reports she is taking 100 mg.

## 2019-09-19 ENCOUNTER — Telehealth: Payer: Self-pay

## 2019-09-19 NOTE — Telephone Encounter (Signed)
I called [employer] to confirm that we could use the current authorization on file, I spoke with nurse reviewer Baxter Flattery who changed the authorization to cover [name] NP. She started a new authorization 09/20/19-10/18/18. DW

## 2019-09-21 ENCOUNTER — Ambulatory Visit (INDEPENDENT_AMBULATORY_CARE_PROVIDER_SITE_OTHER): Payer: BC Managed Care – PPO | Admitting: Family Medicine

## 2019-09-21 ENCOUNTER — Encounter: Payer: Self-pay | Admitting: Family Medicine

## 2019-09-21 ENCOUNTER — Other Ambulatory Visit: Payer: Self-pay

## 2019-09-21 DIAGNOSIS — IMO0002 Reserved for concepts with insufficient information to code with codable children: Secondary | ICD-10-CM

## 2019-09-21 DIAGNOSIS — G43709 Chronic migraine without aura, not intractable, without status migrainosus: Secondary | ICD-10-CM

## 2019-09-21 NOTE — Progress Notes (Signed)
Consent Form Botulism Toxin Injection For Chronic Migraine    Reviewed orally with patient, additionally signature is on file:  Botulism toxin has been approved by the Federal drug administration for treatment of chronic migraine. Botulism toxin does not cure chronic migraine and it may not be effective in some patients.  The administration of botulism toxin is accomplished by injecting a small amount of toxin into the muscles of the neck and head. Dosage must be titrated for each individual. Any benefits resulting from botulism toxin tend to wear off after 3 months with a repeat injection required if benefit is to be maintained. Injections are usually done every 3-4 months with maximum effect peak achieved by about 2 or 3 weeks. Botulism toxin is expensive and you should be sure of what costs you will incur resulting from the injection.  The side effects of botulism toxin use for chronic migraine may include:   -Transient, and usually mild, facial weakness with facial injections  -Transient, and usually mild, head or neck weakness with head/neck injections  -Reduction or loss of forehead facial animation due to forehead muscle weakness  -Eyelid drooping  -Dry eye  -Pain at the site of injection or bruising at the site of injection  -Double vision  -Potential unknown long term risks   Contraindications: You should not have Botox if you are pregnant, nursing, allergic to albumin, have an infection, skin condition, or muscle weakness at the site of the injection, or have myasthenia gravis, Lambert-Eaton syndrome, or ALS.  It is also possible that as with any injection, there may be an allergic reaction or no effect from the medication. Reduced effectiveness after repeated injections is sometimes seen and rarely infection at the injection site may occur. All care will be taken to prevent these side effects. If therapy is given over a long time, atrophy and wasting in the muscle injected may  occur. Occasionally the patient's become refractory to treatment because they develop antibodies to the toxin. In this event, therapy needs to be modified.  I have read the above information and consent to the administration of botulism toxin.    BOTOX PROCEDURE NOTE FOR MIGRAINE HEADACHE  Contraindications and precautions discussed with patient(above). Aseptic procedure was observed and patient tolerated procedure. Procedure performed by Debbora Presto, FNP-C.   The condition has existed for more than 6 months, and pt does not have a diagnosis of ALS, Myasthenia Gravis or Lambert-Eaton Syndrome.  Risks and benefits of injections discussed and pt agrees to proceed with the procedure.  Written consent obtained  These injections are medically necessary. Pt  receives good benefits from these injections. These injections do not cause sedations or hallucinations which the oral therapies may cause.   Description of procedure:  The patient was placed in a sitting position. The standard protocol was used for Botox as follows, with 5 units of Botox injected at each site:  -Procerus muscle, midline injection  -Corrugator muscle, bilateral injection  -LS bilaterally   -Frontalis muscle, bilateral injection, with 2 sites each side, medial injection was performed in the upper one third of the frontalis muscle, in the region vertical from the medial inferior edge of the superior orbital rim. The lateral injection was again in the upper one third of the forehead vertically above the lateral limbus of the cornea, 1.5 cm lateral to the medial injection site.  -Temporalis muscle injection, 4 sites, bilaterally. The first injection was 3 cm above the tragus of the ear, second injection site  was 1.5 cm to 3 cm up from the first injection site in line with the tragus of the ear. The third injection site was 1.5-3 cm forward between the first 2 injection sites. The fourth injection site was 1.5 cm posterior to the  second injection site. 5th site laterally in the temporalis  muscleat the level of the outer canthus.  -Occipitalis muscle injection, 3 sites, bilaterally. The first injection was done one half way between the occipital protuberance and the tip of the mastoid process behind the ear. The second injection site was done lateral and superior to the first, 1 fingerbreadth from the first injection. The third injection site was 1 fingerbreadth superiorly and medially from the first injection site.  -Cervical paraspinal muscle injection, 2 sites, bilateral knee first injection site was 1 cm from the midline of the cervical spine, 3 cm inferior to the lower border of the occipital protuberance. The second injection site was 1.5 cm superiorly and laterally to the first injection site.  -Trapezius muscle injection was performed at 3 sites, bilaterally. The first injection site was in the upper trapezius muscle halfway between the inflection point of the neck, and the acromion. The second injection site was one half way between the acromion and the first injection site. The third injection was done between the first injection site and the inflection point of the neck.   Will return for repeat injection in 3 months.   A 165 units of Botox was used, any Botox not injected was wasted. The patient tolerated the procedure well, there were no complications of the above procedure.   

## 2019-09-21 NOTE — Progress Notes (Signed)
Specialty Pharmacy. [facility].  BOTOX 200Unit/Vial. Single dose Vial. [place] [id number] Lot: [id number] Exp: 05/2022  0.9% NS-2ML [id number] Exp: 02/22  Pt signed consent and had no questions.

## 2019-10-11 ENCOUNTER — Other Ambulatory Visit: Payer: Self-pay | Admitting: Neurology

## 2019-12-22 ENCOUNTER — Ambulatory Visit: Payer: BC Managed Care – PPO | Admitting: Family Medicine

## 2019-12-22 ENCOUNTER — Telehealth: Payer: Self-pay | Admitting: Family Medicine

## 2019-12-22 NOTE — Telephone Encounter (Signed)
BOTOX  12/20/2019 [employer] Approved [place] For Botox [id number] 12/21/2019-12/20/2020 approved for 4 visits Reference # [id number] B/B 2nd Insurance They dont need auth called [employer] [facility]

## 2019-12-27 ENCOUNTER — Ambulatory Visit (INDEPENDENT_AMBULATORY_CARE_PROVIDER_SITE_OTHER): Payer: BC Managed Care – PPO | Admitting: Family Medicine

## 2019-12-27 ENCOUNTER — Other Ambulatory Visit: Payer: Self-pay

## 2019-12-27 DIAGNOSIS — IMO0002 Reserved for concepts with insufficient information to code with codable children: Secondary | ICD-10-CM

## 2019-12-27 DIAGNOSIS — G43709 Chronic migraine without aura, not intractable, without status migrainosus: Secondary | ICD-10-CM

## 2019-12-27 NOTE — Progress Notes (Signed)
 Consent Form Botulism Toxin Injection For Chronic Migraine    Reviewed orally with patient, additionally signature is on file:  Botulism toxin has been approved by the Federal drug administration for treatment of chronic migraine. Botulism toxin does not cure chronic migraine and it may not be effective in some patients.  The administration of botulism toxin is accomplished by injecting a small amount of toxin into the muscles of the neck and head. Dosage must be titrated for each individual. Any benefits resulting from botulism toxin tend to wear off after 3 months with a repeat injection required if benefit is to be maintained. Injections are usually done every 3-4 months with maximum effect peak achieved by about 2 or 3 weeks. Botulism toxin is expensive and you should be sure of what costs you will incur resulting from the injection.  The side effects of botulism toxin use for chronic migraine may include:   -Transient, and usually mild, facial weakness with facial injections  -Transient, and usually mild, head or neck weakness with head/neck injections  -Reduction or loss of forehead facial animation due to forehead muscle weakness  -Eyelid drooping  -Dry eye  -Pain at the site of injection or bruising at the site of injection  -Double vision  -Potential unknown long term risks   Contraindications: You should not have Botox if you are pregnant, nursing, allergic to albumin, have an infection, skin condition, or muscle weakness at the site of the injection, or have myasthenia gravis, Lambert-Eaton syndrome, or ALS.  It is also possible that as with any injection, there may be an allergic reaction or no effect from the medication. Reduced effectiveness after repeated injections is sometimes seen and rarely infection at the injection site may occur. All care will be taken to prevent these side effects. If therapy is given over a long time, atrophy and wasting in the muscle injected may  occur. Occasionally the patient's become refractory to treatment because they develop antibodies to the toxin. In this event, therapy needs to be modified.  I have read the above information and consent to the administration of botulism toxin.    BOTOX PROCEDURE NOTE FOR MIGRAINE HEADACHE  Contraindications and precautions discussed with patient(above). Aseptic procedure was observed and patient tolerated procedure. Procedure performed by Atlas Crossland, FNP-C.   The condition has existed for more than 6 months, and pt does not have a diagnosis of ALS, Myasthenia Gravis or Lambert-Eaton Syndrome.  Risks and benefits of injections discussed and pt agrees to proceed with the procedure.  Written consent obtained  These injections are medically necessary. Pt  receives good benefits from these injections. These injections do not cause sedations or hallucinations which the oral therapies may cause.   Description of procedure:  The patient was placed in a sitting position. The standard protocol was used for Botox as follows, with 5 units of Botox injected at each site:  -Procerus muscle, midline injection  -Corrugator muscle, bilateral injection  -Frontalis muscle, bilateral injection, with 2 sites each side, medial injection was performed in the upper one third of the frontalis muscle, in the region vertical from the medial inferior edge of the superior orbital rim. The lateral injection was again in the upper one third of the forehead vertically above the lateral limbus of the cornea, 1.5 cm lateral to the medial injection site.  -Temporalis muscle injection, 4 sites, bilaterally. The first injection was 3 cm above the tragus of the ear, second injection site was 1.5 cm to   3 cm up from the first injection site in line with the tragus of the ear. The third injection site was 1.5-3 cm forward between the first 2 injection sites. The fourth injection site was 1.5 cm posterior to the second injection  site. 5th site laterally in the temporalis  muscleat the level of the outer canthus.  -Occipitalis muscle injection, 3 sites, bilaterally. The first injection was done one half way between the occipital protuberance and the tip of the mastoid process behind the ear. The second injection site was done lateral and superior to the first, 1 fingerbreadth from the first injection. The third injection site was 1 fingerbreadth superiorly and medially from the first injection site.  -Cervical paraspinal muscle injection, 2 sites, bilaterally. The first injection site was 1 cm from the midline of the cervical spine, 3 cm inferior to the lower border of the occipital protuberance. The second injection site was 1.5 cm superiorly and laterally to the first injection site.  -Trapezius muscle injection was performed at 3 sites, bilaterally. The first injection site was in the upper trapezius muscle halfway between the inflection point of the neck, and the acromion. The second injection site was one half way between the acromion and the first injection site. The third injection was done between the first injection site and the inflection point of the neck.   Will return for repeat injection in 3 months.   A total of 200 units of Botox was prepared, 155 units of Botox was injected as documented above, any Botox not injected was wasted. The patient tolerated the procedure well, there were no complications of the above procedure.   

## 2019-12-27 NOTE — Progress Notes (Signed)
BOTOX B/B [id number] EXP-06/2022 -200 UNITS SALINE [id number]   EXP 12/29/2019 B/B

## 2020-03-05 ENCOUNTER — Telehealth: Payer: Self-pay | Admitting: Family Medicine

## 2020-03-05 NOTE — Telephone Encounter (Signed)
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 Please call back when available.

## 2020-03-19 NOTE — Telephone Encounter (Signed)
I'll send [name] a message and see if she has anything in mind. If not we may just have to wait on a cancellation.

## 2020-03-27 NOTE — Progress Notes (Signed)
Consent Form Botulism Toxin Injection For Chronic Migraine    Reviewed orally with patient, additionally signature is on file:  Botulism toxin has been approved by the Federal drug administration for treatment of chronic migraine. Botulism toxin does not cure chronic migraine and it may not be effective in some patients.  The administration of botulism toxin is accomplished by injecting a small amount of toxin into the muscles of the neck and head. Dosage must be titrated for each individual. Any benefits resulting from botulism toxin tend to wear off after 3 months with a repeat injection required if benefit is to be maintained. Injections are usually done every 3-4 months with maximum effect peak achieved by about 2 or 3 weeks. Botulism toxin is expensive and you should be sure of what costs you will incur resulting from the injection.  The side effects of botulism toxin use for chronic migraine may include:   -Transient, and usually mild, facial weakness with facial injections  -Transient, and usually mild, head or neck weakness with head/neck injections  -Reduction or loss of forehead facial animation due to forehead muscle weakness  -Eyelid drooping  -Dry eye  -Pain at the site of injection or bruising at the site of injection  -Double vision  -Potential unknown long term risks   Contraindications: You should not have Botox if you are pregnant, nursing, allergic to albumin, have an infection, skin condition, or muscle weakness at the site of the injection, or have myasthenia gravis, Lambert-Eaton syndrome, or ALS.  It is also possible that as with any injection, there may be an allergic reaction or no effect from the medication. Reduced effectiveness after repeated injections is sometimes seen and rarely infection at the injection site may occur. All care will be taken to prevent these side effects. If therapy is given over a long time, atrophy and wasting in the muscle injected may  occur. Occasionally the patient's become refractory to treatment because they develop antibodies to the toxin. In this event, therapy needs to be modified.  I have read the above information and consent to the administration of botulism toxin.    BOTOX PROCEDURE NOTE FOR MIGRAINE HEADACHE  Contraindications and precautions discussed with patient(above). Aseptic procedure was observed and patient tolerated procedure. Procedure performed by Debbora Presto, FNP-C.   The condition has existed for more than 6 months, and pt does not have a diagnosis of ALS, Myasthenia Gravis or Lambert-Eaton Syndrome.  Risks and benefits of injections discussed and pt agrees to proceed with the procedure.  Written consent obtained  These injections are medically necessary. Pt  receives good benefits from these injections. These injections do not cause sedations or hallucinations which the oral therapies may cause.   Description of procedure:  The patient was placed in a sitting position. The standard protocol was used for Botox as follows, with 5 units of Botox injected at each site:  -Procerus muscle, midline injection  -Corrugator muscle, bilateral injection  -Frontalis muscle, bilateral injection, with 2 sites each side, medial injection was performed in the upper one third of the frontalis muscle, in the region vertical from the medial inferior edge of the superior orbital rim. The lateral injection was again in the upper one third of the forehead vertically above the lateral limbus of the cornea, 1.5 cm lateral to the medial injection site.  -Temporalis muscle injection, 4 sites, bilaterally. The first injection was 3 cm above the tragus of the ear, second injection site was 1.5 cm to  3 cm up from the first injection site in line with the tragus of the ear. The third injection site was 1.5-3 cm forward between the first 2 injection sites. The fourth injection site was 1.5 cm posterior to the second injection  site. 5th site laterally in the temporalis  muscleat the level of the outer canthus.  -Occipitalis muscle injection, 3 sites, bilaterally. The first injection was done one half way between the occipital protuberance and the tip of the mastoid process behind the ear. The second injection site was done lateral and superior to the first, 1 fingerbreadth from the first injection. The third injection site was 1 fingerbreadth superiorly and medially from the first injection site.  -Cervical paraspinal muscle injection, 2 sites, bilaterally. The first injection site was 1 cm from the midline of the cervical spine, 3 cm inferior to the lower border of the occipital protuberance. The second injection site was 1.5 cm superiorly and laterally to the first injection site.  -Trapezius muscle injection was performed at 3 sites, bilaterally. The first injection site was in the upper trapezius muscle halfway between the inflection point of the neck, and the acromion. The second injection site was one half way between the acromion and the first injection site. The third injection was done between the first injection site and the inflection point of the neck.  -LS bilaterally  -masseter bilaterally   Will return for repeat injection in 3 months.   A total of 200 units of Botox was prepared, 175 units of Botox was injected as documented above, any Botox not injected was wasted. The patient tolerated the procedure well, there were no complications of the above procedure.

## 2020-03-28 ENCOUNTER — Other Ambulatory Visit: Payer: Self-pay

## 2020-03-28 ENCOUNTER — Ambulatory Visit (INDEPENDENT_AMBULATORY_CARE_PROVIDER_SITE_OTHER): Payer: BC Managed Care – PPO | Admitting: Family Medicine

## 2020-03-28 DIAGNOSIS — IMO0002 Reserved for concepts with insufficient information to code with codable children: Secondary | ICD-10-CM

## 2020-03-28 DIAGNOSIS — G43709 Chronic migraine without aura, not intractable, without status migrainosus: Secondary | ICD-10-CM

## 2020-03-28 NOTE — Progress Notes (Signed)
Botox- 200 units x 1 vial Lot: [id number] Expiration: 09/2022 NDC: 6789-3810-17   Bacteriostatic 0.9% Sodium Chloride- 57mL total [id number] Expiration: 02/22 NDC: 82423-536-14   Dx:  b/b

## 2020-03-29 ENCOUNTER — Ambulatory Visit: Payer: BC Managed Care – PPO | Admitting: Family Medicine

## 2020-03-29 NOTE — Telephone Encounter (Signed)
FYI- Patient states [name] would like her to follow-up in 10 days. I checked [name] and Dr. Trevor Mace schedules and there are no openings. I told patient I would keep an eye out. Do you mind keeping an eye on the schedule also?

## 2020-04-07 ENCOUNTER — Other Ambulatory Visit: Payer: Self-pay | Admitting: Neurology

## 2020-04-09 ENCOUNTER — Telehealth: Payer: Self-pay | Admitting: Family Medicine

## 2020-04-09 NOTE — Telephone Encounter (Signed)
I received a fax from [facility] advising that they had no PA on file for patient and could not complete request for Botox to be dispensed by [facility] until PA is acquired. The fax advised me to call [facility] [phone]) and spoke with [name]. [name] states that he is getting a PA rejection on his end, and directed me to call the [facility] [phone]). He transferred me to [facility], who states that there has been no PA on file since January of 2020. She helped me initiate a new one. [id number] for G43.709. (1) 200U vial of Botox. She states that a determination  Will be faxed to me within 24-48 hours.

## 2020-04-09 NOTE — Telephone Encounter (Signed)
Received faxed [facility] approval. PA# [id number] (04/09/20- 04/09/21).   Patient has PA on file with [employer]. PA #[id number] (12/21/19- 12/20/20)

## 2020-04-16 ENCOUNTER — Encounter: Payer: Self-pay | Admitting: Family Medicine

## 2020-04-16 ENCOUNTER — Telehealth (INDEPENDENT_AMBULATORY_CARE_PROVIDER_SITE_OTHER): Payer: BC Managed Care – PPO | Admitting: Family Medicine

## 2020-04-16 DIAGNOSIS — IMO0002 Reserved for concepts with insufficient information to code with codable children: Secondary | ICD-10-CM

## 2020-04-16 DIAGNOSIS — G43709 Chronic migraine without aura, not intractable, without status migrainosus: Secondary | ICD-10-CM | POA: Diagnosis not present

## 2020-04-16 NOTE — Progress Notes (Addendum)
PATIENT: Kimberly Simpson DOB: 10-26-1975  REASON FOR VISIT: follow up HISTORY FROM: patient  Virtual Visit via Telephone Note  I connected with Kimberly Simpson on 04/16/20 at  1:30 PM EDT by telephone and verified that I am speaking with the correct person using two identifiers.   I discussed the limitations, risks, security and privacy concerns of performing an evaluation and management service by telephone and the availability of in person appointments. I also discussed with the patient that there may be a patient responsible charge related to this service. The patient expressed understanding and agreed to proceed.   History of Present Illness:  04/16/20 Kimberly Simpson is a 44 y.o. female here today for follow up for migraines. She is doing fairly well. She continues Botox every 12 weeks. She also continues topiramate 100mg  at bedtime. She does endorse mild "tongue twisting" but feels benefits outweigh side effects. She has a tension style headache in the mornings about 2-3 days a week. Usually caffeine will abort headache. She usually has 2-3 migraines per month. Relpax works but she usually has to take 2 doses. Hot showers help as well. Nurtec did not help. Ondansetron helps with nausea.    History (copied from Dr note on 04/13/2017)  HPI:  Kimberly Simpson is a 44 y.o. female here as a referral from Dr. 46 for neck pain nad cervical radiculopathy. 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 She had lumbar surgery and spinal fusion with residual foot drop. 3 years ago her neck started bothering her. She fell around that time. She started having problems with lifting her arms. She has pain and numbness with lifting arms. She has had nerve conduction studies. 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 She had injections and the last one increased her pain in June. She tried steroids. Numbness is in digits 1-3 of most hands. She gets headaches due to the neck pain. She is getting botox for her migraines at a different neurology office [facility]. Her left arm feels heavy. She has decreased ROM in the cervical muscles. She has daily headaches, migraines are on the right, throbbing and pounding, light and souns sensitivity, nausea, last 24-48 hours and she has 8 migraine days a month. No aura. No medication overuse. Chronic migraines for years.  She was receiving botox with great results, a 50% reduction in headache days and >50% reduction in migraine days per month. No other focal neurologic deficits, associated symptoms, inciting events or modifiable factors. No aura. No medication overuse. Has failed multiple medications for migraine including: Topiramate, Neurontin, Venlafaxine, cymbalta and other medications such as propranolol.  Has failed multiple meds for cervical dystonia: Robaxin, neurontin, tylenol, ibuprofen, steroids, cymbalta, other muscle relaxers such as flexeril, PT, Chiropractic care.   Reviewed notes, labs and imaging from outside physicians, which showed:  Notes from [facility] orthopedics. Patient is on Relpax, methocarbamol, gabapentin and meloxicam. She has numbness and tingling in both hands, degeneration of her intervertebral disc at C6-C7 with radiculopathy. She has central neck pain, is getting injections, has been to a chiropractor, she has pain in the left side of her neck to the left arm, pain at night, stiffness, weakness and numbness bilateral hands and fingers while the patient does not report symptoms of swelling, aching, throbbing, locking, catching, popping, grinding giving way, instability. No  difficulty ambulating. The repeat MRI of her cervical spine was stable compared to her 2017 MRI. No cord signal changes. No significant stenosis. No neural  compression. Findings on cervical MRI very mild.   Reviewed MRI of the cervical spine report (do not have images) which showed no interval change since 2017. At C6-C7 mild disc bulge with flattening of the ventral thecal sac no stenosis otherwise no cervical cord or intraspinal lesion, no disc herniation is central stenosis or foraminal stenosis.   Observations/Objective:  Generalized: Well developed, in no acute distress  Mentation: Alert oriented to time, place, history taking. Follows all commands speech and language fluent   Assessment and Plan:  44 y.o. year old female  has a past medical history of Anxiety, Back pain, Cervical pain, Complication of anesthesia, Depression, Fibromyalgia, GERD (gastroesophageal reflux disease), IBS (irritable bowel syndrome), and Migraines. here with    ICD-10-CM   1. Chronic migraine  G43.709    Kimberly Simpson is doing well on current regimen. We will continue Botox every 12 weeks and topiramate daily. She will continue Relpax and ondansetron as needed for abortive therapy. We have discussed retrying CGRP injection or increasing topiramate if headaches worsen but she feels she is doing well and wishes to continue current treatment plan. Healthy lifestyle habits encouraged. She will follow up annually for office visit. She verbalizes understanding and agreement with this plan.    No orders of the defined types were placed in this encounter.   No orders of the defined types were placed in this encounter.    Follow Up Instructions:  I discussed the assessment and treatment plan with the patient. The patient was provided an opportunity to ask questions and all were answered. The patient agreed with the plan and demonstrated an understanding of the instructions.   The patient was advised to call back or seek an in-person evaluation if the symptoms worsen or if the condition fails to improve as anticipated.  I provided 15 minutes of non-face-to-face time  during this encounter. Patient is located in her parked car during Mychart visit, provider is in the office.    Shawnie Dapper, NP   Made any corrections needed, and agree with history, physical, neuro exam,assessment and plan as stated.     Naomie Dean, MD [facility]

## 2020-06-28 ENCOUNTER — Ambulatory Visit: Payer: BC Managed Care – PPO | Admitting: Family Medicine

## 2020-07-02 ENCOUNTER — Telehealth: Payer: Self-pay | Admitting: Family Medicine

## 2020-07-02 NOTE — Telephone Encounter (Signed)
Patient has Botox appointment on 10/7. I called [facility] and spoke with Zella Ball, who states that the patient's Botox order that was placed in July was cancelled because [facility] was never able to make contact with the patient. I asked Zella Ball if we could restart the order and see if it could be expedited. She states that everything should be okay with the new order because [facility] has patient's PA on file. She advised me to check back in the new few hours or tomorrow.

## 2020-07-03 NOTE — Telephone Encounter (Signed)
I called [facility] to see if delivery of Botox could be scheduled for patient's 10/7 appointment. I spoke with Wynona Canes, who states patient needs to give consent for shipment. I called the patient and advised her of this. Patient states she will call [facility] today to do that.

## 2020-07-04 ENCOUNTER — Ambulatory Visit: Payer: BC Managed Care – PPO | Admitting: Family Medicine

## 2020-07-04 NOTE — Progress Notes (Signed)
She is doing well. Headaches have been better since last visit.   Consent Form Botulism Toxin Injection For Chronic Migraine    Reviewed orally with patient, additionally signature is on file:  Botulism toxin has been approved by the Federal drug administration for treatment of chronic migraine. Botulism toxin does not cure chronic migraine and it may not be effective in some patients.  The administration of botulism toxin is accomplished by injecting a small amount of toxin into the muscles of the neck and head. Dosage must be titrated for each individual. Any benefits resulting from botulism toxin tend to wear off after 3 months with a repeat injection required if benefit is to be maintained. Injections are usually done every 3-4 months with maximum effect peak achieved by about 2 or 3 weeks. Botulism toxin is expensive and you should be sure of what costs you will incur resulting from the injection.  The side effects of botulism toxin use for chronic migraine may include:   -Transient, and usually mild, facial weakness with facial injections  -Transient, and usually mild, head or neck weakness with head/neck injections  -Reduction or loss of forehead facial animation due to forehead muscle weakness  -Eyelid drooping  -Dry eye  -Pain at the site of injection or bruising at the site of injection  -Double vision  -Potential unknown long term risks   Contraindications: You should not have Botox if you are pregnant, nursing, allergic to albumin, have an infection, skin condition, or muscle weakness at the site of the injection, or have myasthenia gravis, Lambert-Eaton syndrome, or ALS.  It is also possible that as with any injection, there may be an allergic reaction or no effect from the medication. Reduced effectiveness after repeated injections is sometimes seen and rarely infection at the injection site may occur. All care will be taken to prevent these side effects. If therapy is  given over a long time, atrophy and wasting in the muscle injected may occur. Occasionally the patient's become refractory to treatment because they develop antibodies to the toxin. In this event, therapy needs to be modified.  I have read the above information and consent to the administration of botulism toxin.    BOTOX PROCEDURE NOTE FOR MIGRAINE HEADACHE  Contraindications and precautions discussed with patient(above). Aseptic procedure was observed and patient tolerated procedure. Procedure performed by Shawnie Dapper, FNP-C.   The condition has existed for more than 6 months, and pt does not have a diagnosis of ALS, Myasthenia Gravis or Lambert-Eaton Syndrome.  Risks and benefits of injections discussed and pt agrees to proceed with the procedure.  Written consent obtained  These injections are medically necessary. Pt  receives good benefits from these injections. These injections do not cause sedations or hallucinations which the oral therapies may cause.   Description of procedure:  The patient was placed in a sitting position. The standard protocol was used for Botox as follows, with 5 units of Botox injected at each site:  -Procerus muscle, midline injection  -Corrugator muscle, bilateral injection  -Frontalis muscle, bilateral injection, with 2 sites each side, medial injection was performed in the upper one third of the frontalis muscle, in the region vertical from the medial inferior edge of the superior orbital rim. The lateral injection was again in the upper one third of the forehead vertically above the lateral limbus of the cornea, 1.5 cm lateral to the medial injection site.  -Temporalis muscle injection, 4 sites, bilaterally. The first injection was 3 cm  above the tragus of the ear, second injection site was 1.5 cm to 3 cm up from the first injection site in line with the tragus of the ear. The third injection site was 1.5-3 cm forward between the first 2 injection sites. The  fourth injection site was 1.5 cm posterior to the second injection site. 5th site laterally in the temporalis  muscleat the level of the outer canthus.  -Occipitalis muscle injection, 3 sites, bilaterally. The first injection was done one half way between the occipital protuberance and the tip of the mastoid process behind the ear. The second injection site was done lateral and superior to the first, 1 fingerbreadth from the first injection. The third injection site was 1 fingerbreadth superiorly and medially from the first injection site.  -Cervical paraspinal muscle injection, 2 sites, bilaterally. The first injection site was 1 cm from the midline of the cervical spine, 3 cm inferior to the lower border of the occipital protuberance. The second injection site was 1.5 cm superiorly and laterally to the first injection site.  -Trapezius muscle injection was performed at 3 sites, bilaterally. The first injection site was in the upper trapezius muscle halfway between the inflection point of the neck, and the acromion. The second injection site was one half way between the acromion and the first injection site. The third injection was done between the first injection site and the inflection point of the neck.  LS: 5 units bilaterally  Masseter: 5 units bilaterally     Will return for repeat injection in 3 months.   A total of 200 units of Botox was prepared, 175 units of Botox was injected as documented above, any Botox not injected was wasted. The patient tolerated the procedure well, there were no complications of the above procedure.

## 2020-07-05 ENCOUNTER — Other Ambulatory Visit: Payer: Self-pay

## 2020-07-05 ENCOUNTER — Ambulatory Visit (INDEPENDENT_AMBULATORY_CARE_PROVIDER_SITE_OTHER): Payer: BC Managed Care – PPO | Admitting: Family Medicine

## 2020-07-05 DIAGNOSIS — G43709 Chronic migraine without aura, not intractable, without status migrainosus: Secondary | ICD-10-CM

## 2020-07-05 NOTE — Progress Notes (Signed)
Botox-100unitsx2 vials Lot: [id number] Expiration: 10/2022 NDC: 1117-3567-01   0.9% Sodium Chloride- 15mL total Lot: [id number] Expiration: 06/2022 NDC: 14388-875-79  Dx: Chronic Migraine SP  Consent signed

## 2020-07-09 ENCOUNTER — Other Ambulatory Visit: Payer: Self-pay | Admitting: Neurology

## 2020-07-10 NOTE — Telephone Encounter (Signed)
Botox delivered today from [facility]. Patient's name was taken off of the vial and I placed vial in office stock to replace Botox used at 10/7 appointment.

## 2020-10-02 ENCOUNTER — Telehealth: Payer: Self-pay | Admitting: Family Medicine

## 2020-10-02 NOTE — Telephone Encounter (Signed)
Patient has a Botox appointment on 1/12. I called [facility] and spoke with Britney to see if delivery of Botox can be scheduled. Botox TBD 1/6.

## 2020-10-09 ENCOUNTER — Encounter: Payer: Self-pay | Admitting: Family Medicine

## 2020-10-10 ENCOUNTER — Ambulatory Visit (INDEPENDENT_AMBULATORY_CARE_PROVIDER_SITE_OTHER): Payer: BC Managed Care – PPO | Admitting: Family Medicine

## 2020-10-10 DIAGNOSIS — G43709 Chronic migraine without aura, not intractable, without status migrainosus: Secondary | ICD-10-CM | POA: Diagnosis not present

## 2020-10-10 NOTE — Progress Notes (Signed)
Botox- 200 units x ** vial Lot: [id number] Expiration: 06/2023 NDC: [address]-02  Bacteriostatic 0.9% Sodium Chloride- 30mL total [id number] Expiration: 02/23 NDC: 53299-242-68  Dx: G43.709 SP

## 2020-10-10 NOTE — Progress Notes (Signed)
She is doing well. Headaches have been better since last visit. She has not had to use abortive therapy recently.   Consent Form Botulism Toxin Injection For Chronic Migraine    Reviewed orally with patient, additionally signature is on file:  Botulism toxin has been approved by the Federal drug administration for treatment of chronic migraine. Botulism toxin does not cure chronic migraine and it may not be effective in some patients.  The administration of botulism toxin is accomplished by injecting a small amount of toxin into the muscles of the neck and head. Dosage must be titrated for each individual. Any benefits resulting from botulism toxin tend to wear off after 3 months with a repeat injection required if benefit is to be maintained. Injections are usually done every 3-4 months with maximum effect peak achieved by about 2 or 3 weeks. Botulism toxin is expensive and you should be sure of what costs you will incur resulting from the injection.  The side effects of botulism toxin use for chronic migraine may include:   -Transient, and usually mild, facial weakness with facial injections  -Transient, and usually mild, head or neck weakness with head/neck injections  -Reduction or loss of forehead facial animation due to forehead muscle weakness  -Eyelid drooping  -Dry eye  -Pain at the site of injection or bruising at the site of injection  -Double vision  -Potential unknown long term risks   Contraindications: You should not have Botox if you are pregnant, nursing, allergic to albumin, have an infection, skin condition, or muscle weakness at the site of the injection, or have myasthenia gravis, Lambert-Eaton syndrome, or ALS.  It is also possible that as with any injection, there may be an allergic reaction or no effect from the medication. Reduced effectiveness after repeated injections is sometimes seen and rarely infection at the injection site may occur. All care will be  taken to prevent these side effects. If therapy is given over a long time, atrophy and wasting in the muscle injected may occur. Occasionally the patient's become refractory to treatment because they develop antibodies to the toxin. In this event, therapy needs to be modified.  I have read the above information and consent to the administration of botulism toxin.    BOTOX PROCEDURE NOTE FOR MIGRAINE HEADACHE  Contraindications and precautions discussed with patient(above). Aseptic procedure was observed and patient tolerated procedure. Procedure performed by Shawnie Dapper, FNP-C.   The condition has existed for more than 6 months, and pt does not have a diagnosis of ALS, Myasthenia Gravis or Lambert-Eaton Syndrome.  Risks and benefits of injections discussed and pt agrees to proceed with the procedure.  Written consent obtained  These injections are medically necessary. Pt  receives good benefits from these injections. These injections do not cause sedations or hallucinations which the oral therapies may cause.   Description of procedure:  The patient was placed in a sitting position. The standard protocol was used for Botox as follows, with 5 units of Botox injected at each site:  -Procerus muscle, midline injection  -Corrugator muscle, bilateral injection  -Frontalis muscle, bilateral injection, with 2 sites each side, medial injection was performed in the upper one third of the frontalis muscle, in the region vertical from the medial inferior edge of the superior orbital rim. The lateral injection was again in the upper one third of the forehead vertically above the lateral limbus of the cornea, 1.5 cm lateral to the medial injection site.  -Temporalis muscle injection,  4 sites, bilaterally. The first injection was 3 cm above the tragus of the ear, second injection site was 1.5 cm to 3 cm up from the first injection site in line with the tragus of the ear. The third injection site was 1.5-3  cm forward between the first 2 injection sites. The fourth injection site was 1.5 cm posterior to the second injection site. 5th site laterally in the temporalis  muscleat the level of the outer canthus.  -Occipitalis muscle injection, 3 sites, bilaterally. The first injection was done one half way between the occipital protuberance and the tip of the mastoid process behind the ear. The second injection site was done lateral and superior to the first, 1 fingerbreadth from the first injection. The third injection site was 1 fingerbreadth superiorly and medially from the first injection site.  -Cervical paraspinal muscle injection, 2 sites, bilaterally. The first injection site was 1 cm from the midline of the cervical spine, 3 cm inferior to the lower border of the occipital protuberance. The second injection site was 1.5 cm superiorly and laterally to the first injection site.  -Trapezius muscle injection was performed at 3 sites, bilaterally. The first injection site was in the upper trapezius muscle halfway between the inflection point of the neck, and the acromion. The second injection site was one half way between the acromion and the first injection site. The third injection was done between the first injection site and the inflection point of the neck.  LS: 5 units bilaterally  Masseter: 5 units bilaterally     Will return for repeat injection in 3 months.   A total of 200 units of Botox was prepared, 175 units of Botox was injected as documented above, any Botox not injected was wasted. The patient tolerated the procedure well, there were no complications of the above procedure.

## 2020-10-16 NOTE — Telephone Encounter (Signed)
Submitted new PA with updated insurance information to [facility] for Botox. PA approved. Reference #[id number] (10/10/20- 10/09/21).

## 2020-11-19 ENCOUNTER — Telehealth: Payer: Self-pay | Admitting: Family Medicine

## 2020-11-19 NOTE — Telephone Encounter (Signed)
That stinks. I never mentioned buy and bill to her. Should we ask angie to get involved?

## 2020-11-19 NOTE — Telephone Encounter (Signed)
Patient called stating that she has received two bills from the specialty pharmacy ([facility]) each around $1,000.00 for her Botox. The patient states she is not sure why she has gotten the bills because she was under the impression that she was getting her Botox from our office (patient gave consent to [facility] in October and January for Botox shipment, I have this noted and also verified by [facility]). I advised the patient that I would call to make sure that [facility] had both of her insurances on file. I called [facility] and spoke with Vincenza Hews who states both of patient's insurances are on file. She informed me that patient gave consent for both October and January shipments.

## 2020-11-19 NOTE — Telephone Encounter (Signed)
Make sure she is aware of the botox copay program, she should submit asap though as they have a time limit.

## 2020-11-19 NOTE — Telephone Encounter (Signed)
Just wanted to make sure I didn't use specialty pharmacy for patient when I should have done buy and bill. Angie sent this patient to me after she left a voicemail for billing. I have done everything I can do to help patient. She will just have to pay the amount she owes for Botox to the specialty pharmacy unfortunately. If it's okay with you, in the future we can buy and bill to possibly help patient.

## 2020-11-19 NOTE — Telephone Encounter (Signed)
That's is fine with me if ok with angie.

## 2021-01-09 NOTE — Telephone Encounter (Signed)
Patient's next Botox is 4/26. Received fax today from [facility] stating Botox TBD 4/19.

## 2021-01-10 ENCOUNTER — Encounter: Payer: Self-pay | Admitting: Family Medicine

## 2021-01-14 MED ORDER — TOPIRAMATE 100 MG PO TABS: 100.0000 mg | ORAL_TABLET | Freq: Every day | ORAL | 1 refills | Status: DC

## 2021-01-15 NOTE — Telephone Encounter (Signed)
Received (1) 200 unit vial of Botox today from [facility]. 

## 2021-01-21 NOTE — Progress Notes (Deleted)
01/22/2021 ALL:   10/10/2020 ALL: She is doing well. Headaches have been better since last visit. She has not had to use abortive therapy recently.   Consent Form Botulism Toxin Injection For Chronic Migraine    Reviewed orally with patient, additionally signature is on file:  Botulism toxin has been approved by the Federal drug administration for treatment of chronic migraine. Botulism toxin does not cure chronic migraine and it may not be effective in some patients.  The administration of botulism toxin is accomplished by injecting a small amount of toxin into the muscles of the neck and head. Dosage must be titrated for each individual. Any benefits resulting from botulism toxin tend to wear off after 3 months with a repeat injection required if benefit is to be maintained. Injections are usually done every 3-4 months with maximum effect peak achieved by about 2 or 3 weeks. Botulism toxin is expensive and you should be sure of what costs you will incur resulting from the injection.  The side effects of botulism toxin use for chronic migraine may include:   -Transient, and usually mild, facial weakness with facial injections  -Transient, and usually mild, head or neck weakness with head/neck injections  -Reduction or loss of forehead facial animation due to forehead muscle weakness  -Eyelid drooping  -Dry eye  -Pain at the site of injection or bruising at the site of injection  -Double vision  -Potential unknown long term risks   Contraindications: You should not have Botox if you are pregnant, nursing, allergic to albumin, have an infection, skin condition, or muscle weakness at the site of the injection, or have myasthenia gravis, Lambert-Eaton syndrome, or ALS.  It is also possible that as with any injection, there may be an allergic reaction or no effect from the medication. Reduced effectiveness after repeated injections is sometimes seen and rarely infection at the injection  site may occur. All care will be taken to prevent these side effects. If therapy is given over a long time, atrophy and wasting in the muscle injected may occur. Occasionally the patient's become refractory to treatment because they develop antibodies to the toxin. In this event, therapy needs to be modified.  I have read the above information and consent to the administration of botulism toxin.    BOTOX PROCEDURE NOTE FOR MIGRAINE HEADACHE  Contraindications and precautions discussed with patient(above). Aseptic procedure was observed and patient tolerated procedure. Procedure performed by Shawnie Dapper, FNP-C.   The condition has existed for more than 6 months, and pt does not have a diagnosis of ALS, Myasthenia Gravis or Lambert-Eaton Syndrome.  Risks and benefits of injections discussed and pt agrees to proceed with the procedure.  Written consent obtained  These injections are medically necessary. Pt  receives good benefits from these injections. These injections do not cause sedations or hallucinations which the oral therapies may cause.   Description of procedure:  The patient was placed in a sitting position. The standard protocol was used for Botox as follows, with 5 units of Botox injected at each site:  -Procerus muscle, midline injection  -Corrugator muscle, bilateral injection  -Frontalis muscle, bilateral injection, with 2 sites each side, medial injection was performed in the upper one third of the frontalis muscle, in the region vertical from the medial inferior edge of the superior orbital rim. The lateral injection was again in the upper one third of the forehead vertically above the lateral limbus of the cornea, 1.5 cm lateral to the  medial injection site.  -Temporalis muscle injection, 4 sites, bilaterally. The first injection was 3 cm above the tragus of the ear, second injection site was 1.5 cm to 3 cm up from the first injection site in line with the tragus of the ear. The  third injection site was 1.5-3 cm forward between the first 2 injection sites. The fourth injection site was 1.5 cm posterior to the second injection site. 5th site laterally in the temporalis  muscleat the level of the outer canthus.  -Occipitalis muscle injection, 3 sites, bilaterally. The first injection was done one half way between the occipital protuberance and the tip of the mastoid process behind the ear. The second injection site was done lateral and superior to the first, 1 fingerbreadth from the first injection. The third injection site was 1 fingerbreadth superiorly and medially from the first injection site.  -Cervical paraspinal muscle injection, 2 sites, bilaterally. The first injection site was 1 cm from the midline of the cervical spine, 3 cm inferior to the lower border of the occipital protuberance. The second injection site was 1.5 cm superiorly and laterally to the first injection site.  -Trapezius muscle injection was performed at 3 sites, bilaterally. The first injection site was in the upper trapezius muscle halfway between the inflection point of the neck, and the acromion. The second injection site was one half way between the acromion and the first injection site. The third injection was done between the first injection site and the inflection point of the neck.  LS: 5 units bilaterally  Masseter: 5 units bilaterally     Will return for repeat injection in 3 months.   A total of 200 units of Botox was prepared, 175 units of Botox was injected as documented above, any Botox not injected was wasted. The patient tolerated the procedure well, there were no complications of the above procedure.

## 2021-01-22 ENCOUNTER — Ambulatory Visit: Payer: BC Managed Care – PPO | Admitting: Family Medicine

## 2021-01-22 DIAGNOSIS — G43709 Chronic migraine without aura, not intractable, without status migrainosus: Secondary | ICD-10-CM

## 2021-01-22 NOTE — Telephone Encounter (Signed)
Patient has an appointment for Botox with [name] on 5/12. After her 5/12 appointment I will change the authorization to be under Dr. Lucia Gaskins for injections.

## 2021-01-22 NOTE — Telephone Encounter (Signed)
She can be scheduled with me for botox. She wants to come to me for botox right? Or is she wanting an appointment? Just make sure she is referring to botox and that is fine thanks

## 2021-01-24 ENCOUNTER — Ambulatory Visit: Payer: BC Managed Care – PPO | Admitting: Family Medicine

## 2021-02-04 ENCOUNTER — Telehealth: Payer: Self-pay

## 2021-02-04 NOTE — Telephone Encounter (Signed)
emailed

## 2021-02-04 NOTE — Telephone Encounter (Signed)
Patient called she is requesting a medical records request form to be sent to her via e-mail: [email or web address] call [phone]

## 2021-02-06 NOTE — Progress Notes (Addendum)
02/07/2021 ALL: She continue to do well on Botox and topiramate. Relpax works well for abortive therapy. She rarely has migraines until the week prior to Botox. Unfortunately, she is behind a few weeks due to last appt being canceled due to me being out sick.   Dr Lucia Gaskins prescribed Nurtec 02/2019. Patient does not remember taking this for abortive therapy. Has tried and failed sumatriptan.   10/10/2020 ALL: She is doing well. Headaches have been better since last visit. She has not had to use abortive therapy recently.   Consent Form Botulism Toxin Injection For Chronic Migraine    Reviewed orally with patient, additionally signature is on file:  Botulism toxin has been approved by the Federal drug administration for treatment of chronic migraine. Botulism toxin does not cure chronic migraine and it may not be effective in some patients.  The administration of botulism toxin is accomplished by injecting a small amount of toxin into the muscles of the neck and head. Dosage must be titrated for each individual. Any benefits resulting from botulism toxin tend to wear off after 3 months with a repeat injection required if benefit is to be maintained. Injections are usually done every 3-4 months with maximum effect peak achieved by about 2 or 3 weeks. Botulism toxin is expensive and you should be sure of what costs you will incur resulting from the injection.  The side effects of botulism toxin use for chronic migraine may include:   -Transient, and usually mild, facial weakness with facial injections  -Transient, and usually mild, head or neck weakness with head/neck injections  -Reduction or loss of forehead facial animation due to forehead muscle weakness  -Eyelid drooping  -Dry eye  -Pain at the site of injection or bruising at the site of injection  -Double vision  -Potential unknown long term risks   Contraindications: You should not have Botox if you are pregnant, nursing, allergic  to albumin, have an infection, skin condition, or muscle weakness at the site of the injection, or have myasthenia gravis, Lambert-Eaton syndrome, or ALS.  It is also possible that as with any injection, there may be an allergic reaction or no effect from the medication. Reduced effectiveness after repeated injections is sometimes seen and rarely infection at the injection site may occur. All care will be taken to prevent these side effects. If therapy is given over a long time, atrophy and wasting in the muscle injected may occur. Occasionally the patient's become refractory to treatment because they develop antibodies to the toxin. In this event, therapy needs to be modified.  I have read the above information and consent to the administration of botulism toxin.    BOTOX PROCEDURE NOTE FOR MIGRAINE HEADACHE  Contraindications and precautions discussed with patient(above). Aseptic procedure was observed and patient tolerated procedure. Procedure performed by Shawnie Dapper, FNP-C.   The condition has existed for more than 6 months, and pt does not have a diagnosis of ALS, Myasthenia Gravis or Lambert-Eaton Syndrome.  Risks and benefits of injections discussed and pt agrees to proceed with the procedure.  Written consent obtained  These injections are medically necessary. Pt  receives good benefits from these injections. These injections do not cause sedations or hallucinations which the oral therapies may cause.   Description of procedure:  The patient was placed in a sitting position. The standard protocol was used for Botox as follows, with 5 units of Botox injected at each site:  -Procerus muscle, midline injection  -Corrugator muscle,  bilateral injection  -Frontalis muscle, bilateral injection, with 2 sites each side, medial injection was performed in the upper one third of the frontalis muscle, in the region vertical from the medial inferior edge of the superior orbital rim. The lateral  injection was again in the upper one third of the forehead vertically above the lateral limbus of the cornea, 1.5 cm lateral to the medial injection site.  -Temporalis muscle injection, 4 sites, bilaterally. The first injection was 3 cm above the tragus of the ear, second injection site was 1.5 cm to 3 cm up from the first injection site in line with the tragus of the ear. The third injection site was 1.5-3 cm forward between the first 2 injection sites. The fourth injection site was 1.5 cm posterior to the second injection site. 5th site laterally in the temporalis  muscleat the level of the outer canthus.  -Occipitalis muscle injection, 3 sites, bilaterally. The first injection was done one half way between the occipital protuberance and the tip of the mastoid process behind the ear. The second injection site was done lateral and superior to the first, 1 fingerbreadth from the first injection. The third injection site was 1 fingerbreadth superiorly and medially from the first injection site.  -Cervical paraspinal muscle injection, 2 sites, bilaterally. The first injection site was 1 cm from the midline of the cervical spine, 3 cm inferior to the lower border of the occipital protuberance. The second injection site was 1.5 cm superiorly and laterally to the first injection site.  -Trapezius muscle injection was performed at 3 sites, bilaterally. The first injection site was in the upper trapezius muscle halfway between the inflection point of the neck, and the acromion. The second injection site was one half way between the acromion and the first injection site. The third injection was done between the first injection site and the inflection point of the neck.  LS: 5 units bilaterally  Masseter: 5 units bilaterally     Will return for repeat injection in 3 months.   A total of 200 units of Botox was prepared, 175 units of Botox was injected as documented above, any Botox not injected was wasted.  The patient tolerated the procedure well, there were no complications of the above procedure.  Agree with procedure. Naomie Dean MD

## 2021-02-07 ENCOUNTER — Encounter: Payer: Self-pay | Admitting: Family Medicine

## 2021-02-07 ENCOUNTER — Ambulatory Visit (INDEPENDENT_AMBULATORY_CARE_PROVIDER_SITE_OTHER): Payer: BC Managed Care – PPO | Admitting: Family Medicine

## 2021-02-07 DIAGNOSIS — G43709 Chronic migraine without aura, not intractable, without status migrainosus: Secondary | ICD-10-CM

## 2021-02-07 MED ORDER — ELETRIPTAN HYDROBROMIDE 40 MG PO TABS: 40.0000 mg | ORAL_TABLET | ORAL | 11 refills | Status: DC | PRN

## 2021-02-07 NOTE — Progress Notes (Signed)
Botox- 200 units x 1 vial Lot: [id number] Expiration: 12/24 NDC: 7681-1572-62  Bacteriostatic 0.9% Sodium Chloride- 56mL total [id number] Expiration: 02/23 NDC: 84536-468-03  Dx: O12.248 SP

## 2021-02-11 ENCOUNTER — Ambulatory Visit: Payer: Self-pay

## 2021-02-11 ENCOUNTER — Encounter: Payer: Self-pay | Admitting: Physician Assistant

## 2021-02-11 ENCOUNTER — Ambulatory Visit (INDEPENDENT_AMBULATORY_CARE_PROVIDER_SITE_OTHER): Payer: BC Managed Care – PPO | Admitting: Physician Assistant

## 2021-02-11 DIAGNOSIS — M79601 Pain in right arm: Secondary | ICD-10-CM | POA: Diagnosis not present

## 2021-02-11 DIAGNOSIS — M79644 Pain in right finger(s): Secondary | ICD-10-CM | POA: Diagnosis not present

## 2021-02-11 NOTE — Progress Notes (Signed)
Office Visit Note   Patient: Kimberly Simpson           Date of Birth: [birth year]           MRN: [id number] Visit Date: 02/11/2021              Requested by: Clayborn Heron, MD [address] [place],  [place] [address] PCP: Clayborn Heron, MD   Assessment & Plan: Visit Diagnoses:  1. Pain of right thumb   2. Pain of right upper extremity     Plan: Recommend MRI right wrist and hand evaluate first extensor compartment tenosynovitis and CMC joint for arthritis.  Have her follow-up with Dr. Magnus Ivan after the MRI to go over results discuss further treatment.  Questions were encouraged and answered at length.  Follow-Up Instructions: Return in about 2 weeks (around 02/25/2021).   Orders:  Orders Placed This Encounter  Procedures  . XR Forearm Right  . XR Finger Thumb Right   No orders of the defined types were placed in this encounter.     Procedures: No procedures performed   Clinical Data: No additional findings.   Subjective: Chief Complaint  Patient presents with  . Right Thumb - Pain  . Right Forearm - Pain    HPI Kimberly Simpson is 45 year old female comes in today with right hand pain.  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  She is right-hand dominant.  She states her right hand feels weak.  Pain is getting worse.  She is having no numbness tingling the hands had prior carpal tunnel release on this side.  She is unable to hold objects due to the pain she is tried bracing thumb.  She also notes writing typing and became more more of an issue.  She has pain at the base of the thumb she points from the [facility] joint all the way up into the first extensor compartment area.  She has had at least 3 injections 1 over the first extensor compartment and to near her in the [facility] joint area.  She states these injections all helped some but the pain did not last and did not give  her complete relief.  She is also tried meloxicam and is unsure if this helps much.  Review of Systems See HPI otherwise negative or noncontributory  Objective: Vital Signs: There were no vitals taken for this visit.  Physical Exam General: Well-developed well-nourished female no acute distress mood affect appropriate. Psych: Alert and oriented x3 Ortho Exam Bilateral hands full sensation full motor.  Negative grind test on the left positive grind test on the right.  Tenderness over the right CMC joint region.  Finkelstein's positive on the right negative on the left.  Slight tenderness over the right first extensor compartment.  Full supination pronation forearm bilaterally.  Tenderness over the medial lateral epicondyle region of the right elbow only.  Provocative maneuvers right wrist causes no pain or discomfort at the elbow.  There is no rashes skin lesions ulcerations about the wrist or hand hands bilaterally.  Specialty Comments:  No specialty comments available.  Imaging: XR Finger Thumb Right  Result Date: 02/11/2021 Right thumb 3 views: No acute fractures.  No subluxation dislocations.  Mild spurring but no evidence of bone-on-bone arthritis involving the [facility] joint.   XR Forearm Right  Result Date: 02/11/2021 Right forearm 2 views: No acute fractures  no bony abnormalities.  No subluxation dislocation elbow or wrist.    PMFS History: Patient Active Problem List   Diagnosis Date Noted  . Admission for adjustment of gastric lap band 03/23/2014  . Gastric band slippage 03/22/2014  . surgical takedown of plication and anterior slip and replication June 2015 03/22/2014  . 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 Left ankle sprain 12/15/2013  . Injury of left shoulder 12/15/2013  . Lapband APS + [facility] repair April 2009 11/02/2012   Past Medical History:  Diagnosis Date  . Anxiety   . Back pain   . Cervical pain   . Complication of anesthesia    headaches and BP drops at  times  . Depression   . Fibromyalgia   . GERD (gastroesophageal reflux disease)   . IBS (irritable bowel syndrome)   . Migraines     Family History  Problem Relation Age of Onset  . Hypertension Mother   . Hyperlipidemia Father   . Rheum arthritis Father   . Hypertension Paternal Grandmother   . Heart failure Paternal Grandmother     Past Surgical History:  Procedure Laterality Date  . BACK SURGERY     disc rupture and fusion  . CARPAL TUNNEL RELEASE Right 04/2017  . INTRAUTERINE DEVICE INSERTION    . LAPAROSCOPIC GASTRIC BANDING  12/30/2007  . LAPAROSCOPIC REVISION OF GASTRIC BAND N/A 03/22/2014   Procedure: LAPAROSCOPIC REVISION OF GASTRIC BAND;  Surgeon: Valarie Merino, MD;  Location: [facility];  Service: General;  Laterality: N/A;  . left knee surgery     lateral release   . SPINE SURGERY    . TONSILLECTOMY    . TYMPANOSTOMY TUBE PLACEMENT    . UPPER GI ENDOSCOPY  03/22/2014   Procedure: UPPER GI ENDOSCOPY;  Surgeon: Valarie Merino, MD;  Location: [facility];  Service: General;;   Social History   Occupational History  . Occupation: Runner, broadcasting/film/video  Tobacco Use  . Smoking status: Former Smoker    Packs/day: 0.50    Types: Cigarettes    Quit date: 03/18/1997    Years since quitting: 23.9  . Smokeless tobacco: Never Used  Vaping Use  . Vaping Use: Never used  Substance and Sexual Activity  . Alcohol use: Yes    Comment: rarely  . Drug use: No  . Sexual activity: Yes    Birth control/protection: I.U.D.

## 2021-02-12 NOTE — Addendum Note (Signed)
Addended by: Barbette Or on: 02/12/2021 12:50 PM   Modules accepted: Orders

## 2021-02-20 ENCOUNTER — Ambulatory Visit: Payer: BC Managed Care – PPO | Admitting: Orthopaedic Surgery

## 2021-02-27 ENCOUNTER — Ambulatory Visit: Payer: BC Managed Care – PPO | Admitting: Orthopaedic Surgery

## 2021-03-04 ENCOUNTER — Ambulatory Visit: Payer: BC Managed Care – PPO | Admitting: Orthopaedic Surgery

## 2021-03-13 ENCOUNTER — Encounter: Payer: Self-pay | Admitting: Orthopaedic Surgery

## 2021-03-13 ENCOUNTER — Encounter: Payer: Self-pay | Admitting: Family Medicine

## 2021-03-13 ENCOUNTER — Ambulatory Visit (INDEPENDENT_AMBULATORY_CARE_PROVIDER_SITE_OTHER): Payer: BC Managed Care – PPO | Admitting: Orthopaedic Surgery

## 2021-03-13 DIAGNOSIS — M79601 Pain in right arm: Secondary | ICD-10-CM

## 2021-03-13 DIAGNOSIS — M79644 Pain in right finger(s): Secondary | ICD-10-CM | POA: Diagnosis not present

## 2021-03-13 NOTE — Progress Notes (Signed)
The patient comes in today to go over an MRI of her right wrist and thumb.  She actually sees a hand specialist with Dr. Janee Morn at [facility] and he has done endoscopic carpal tunnel surgery on that side.  He is recommended a CMC arthroplasty and she will to get a second opinion.  She does trust him and I do as well.  We did send her for an MRI of this wrist and hand and she is here for review this today.  I have actually performed her replacement on the father and see her mother is a patient as well.  Her pain is around her right dominant thumb and wrist.  It does radiate into the elbow area.  It is frustrating for her and is detrimentally affecting her actives daily living.  She does consistently have pain around the base of the thumb with a positive grind test on the right side.  The MRI does show moderate arthritis in this area with a para-articular osteophyte as well and this may be what is causing her the most pain.  There is also pain of the radial styloid.  Her elbow exam is normal.  There is no atrophy in the hand either in her neurovascular exam is normal.  I agree with her seeing Dr. Janee Morn to consider basilar thumb joint surgery on the right side.  All question concerns were answered addressed.  This gave her reassurance that she is heading in the right direction with considering surgery.

## 2021-05-13 ENCOUNTER — Ambulatory Visit: Payer: BC Managed Care – PPO | Admitting: Neurology

## 2021-05-16 ENCOUNTER — Ambulatory Visit (INDEPENDENT_AMBULATORY_CARE_PROVIDER_SITE_OTHER): Payer: BC Managed Care – PPO | Admitting: Family Medicine

## 2021-05-16 DIAGNOSIS — G43709 Chronic migraine without aura, not intractable, without status migrainosus: Secondary | ICD-10-CM

## 2021-05-16 MED ORDER — TOPIRAMATE 100 MG PO TABS: 100.0000 mg | ORAL_TABLET | Freq: Every day | ORAL | 1 refills | Status: DC

## 2021-05-16 NOTE — Progress Notes (Signed)
Botox- 200 units x 1 vial Lot: [id number]  Expiration: 01/25 NDC: 8841-6606-30  Bacteriostatic 0.9% Sodium Chloride- 56mL total Lot: [id number] Expiration: 09/29/22 NDC: 3235-5732-20  Dx: U54.270 S/P

## 2021-05-16 NOTE — Progress Notes (Signed)
05/16/2021 ALL: She continues Botox, topiramate and Relpax. She is doing well. Not having headaches. She had Covid last week but has recovered well. She did take Paxlovid.   02/07/2021 ALL: She continue to do well on Botox and topiramate. Relpax works well for abortive therapy. She rarely has migraines until the week prior to Botox. Unfortunately, she is behind a few weeks due to last appt being canceled due to me being out sick.   Dr Lucia Gaskins prescribed Nurtec 02/2019. Patient does not remember taking this for abortive therapy. Has tried and failed sumatriptan.   10/10/2020 ALL: She is doing well. Headaches have been better since last visit. She has not had to use abortive therapy recently.   Consent Form Botulism Toxin Injection For Chronic Migraine    Reviewed orally with patient, additionally signature is on file:  Botulism toxin has been approved by the Federal drug administration for treatment of chronic migraine. Botulism toxin does not cure chronic migraine and it may not be effective in some patients.  The administration of botulism toxin is accomplished by injecting a small amount of toxin into the muscles of the neck and head. Dosage must be titrated for each individual. Any benefits resulting from botulism toxin tend to wear off after 3 months with a repeat injection required if benefit is to be maintained. Injections are usually done every 3-4 months with maximum effect peak achieved by about 2 or 3 weeks. Botulism toxin is expensive and you should be sure of what costs you will incur resulting from the injection.  The side effects of botulism toxin use for chronic migraine may include:   -Transient, and usually mild, facial weakness with facial injections  -Transient, and usually mild, head or neck weakness with head/neck injections  -Reduction or loss of forehead facial animation due to forehead muscle weakness  -Eyelid drooping  -Dry eye  -Pain at the site of injection or  bruising at the site of injection  -Double vision  -Potential unknown long term risks   Contraindications: You should not have Botox if you are pregnant, nursing, allergic to albumin, have an infection, skin condition, or muscle weakness at the site of the injection, or have myasthenia gravis, Lambert-Eaton syndrome, or ALS.  It is also possible that as with any injection, there may be an allergic reaction or no effect from the medication. Reduced effectiveness after repeated injections is sometimes seen and rarely infection at the injection site may occur. All care will be taken to prevent these side effects. If therapy is given over a long time, atrophy and wasting in the muscle injected may occur. Occasionally the patient's become refractory to treatment because they develop antibodies to the toxin. In this event, therapy needs to be modified.  I have read the above information and consent to the administration of botulism toxin.    BOTOX PROCEDURE NOTE FOR MIGRAINE HEADACHE  Contraindications and precautions discussed with patient(above). Aseptic procedure was observed and patient tolerated procedure. Procedure performed by Shawnie Dapper, FNP-C.   The condition has existed for more than 6 months, and pt does not have a diagnosis of ALS, Myasthenia Gravis or Lambert-Eaton Syndrome.  Risks and benefits of injections discussed and pt agrees to proceed with the procedure.  Written consent obtained  These injections are medically necessary. Pt  receives good benefits from these injections. These injections do not cause sedations or hallucinations which the oral therapies may cause.   Description of procedure:  The patient was placed in  a sitting position. The standard protocol was used for Botox as follows, with 5 units of Botox injected at each site:  -Procerus muscle, midline injection  -Corrugator muscle, bilateral injection  -Frontalis muscle, bilateral injection, with 2 sites each side,  medial injection was performed in the upper one third of the frontalis muscle, in the region vertical from the medial inferior edge of the superior orbital rim. The lateral injection was again in the upper one third of the forehead vertically above the lateral limbus of the cornea, 1.5 cm lateral to the medial injection site.  -Temporalis muscle injection, 4 sites, bilaterally. The first injection was 3 cm above the tragus of the ear, second injection site was 1.5 cm to 3 cm up from the first injection site in line with the tragus of the ear. The third injection site was 1.5-3 cm forward between the first 2 injection sites. The fourth injection site was 1.5 cm posterior to the second injection site. 5th site laterally in the temporalis  muscleat the level of the outer canthus.  -Occipitalis muscle injection, 3 sites, bilaterally. The first injection was done one half way between the occipital protuberance and the tip of the mastoid process behind the ear. The second injection site was done lateral and superior to the first, 1 fingerbreadth from the first injection. The third injection site was 1 fingerbreadth superiorly and medially from the first injection site.  -Cervical paraspinal muscle injection, 2 sites, bilaterally. The first injection site was 1 cm from the midline of the cervical spine, 3 cm inferior to the lower border of the occipital protuberance. The second injection site was 1.5 cm superiorly and laterally to the first injection site.  -Trapezius muscle injection was performed at 3 sites, bilaterally. The first injection site was in the upper trapezius muscle halfway between the inflection point of the neck, and the acromion. The second injection site was one half way between the acromion and the first injection site. The third injection was done between the first injection site and the inflection point of the neck.  LS: 5 units bilaterally  Masseter: 5 units bilaterally     Will  return for repeat injection in 3 months.   A total of 200 units of Botox was prepared, 175 units of Botox was injected as documented above, any Botox not injected was wasted. The patient tolerated the procedure well, there were no complications of the above procedure.

## 2021-07-14 ENCOUNTER — Encounter: Payer: Self-pay | Admitting: Family Medicine

## 2021-07-15 ENCOUNTER — Other Ambulatory Visit: Payer: Self-pay | Admitting: Neurology

## 2021-07-15 MED ORDER — TOPIRAMATE 100 MG PO TABS: 100.0000 mg | ORAL_TABLET | Freq: Every day | ORAL | 1 refills | Status: DC

## 2021-08-20 ENCOUNTER — Other Ambulatory Visit: Payer: Self-pay

## 2021-08-20 ENCOUNTER — Ambulatory Visit (INDEPENDENT_AMBULATORY_CARE_PROVIDER_SITE_OTHER): Payer: BC Managed Care – PPO | Admitting: Neurology

## 2021-08-20 ENCOUNTER — Ambulatory Visit: Payer: BC Managed Care – PPO | Admitting: Neurology

## 2021-08-20 DIAGNOSIS — G43709 Chronic migraine without aura, not intractable, without status migrainosus: Secondary | ICD-10-CM | POA: Diagnosis not present

## 2021-08-20 NOTE — Progress Notes (Signed)
08/09/2021 She continues Botox, topiramate and Relpax. She is doing well. Not having headaches. She rarely has migraines until the week prior to Botox.she sees lisa poulos, was the one who connected [place]. She has a son in [place] now working for the stadium and is going to see [name], she has another graduating next [employer]. Do the botox high in the forehead she has a heavy brow.  Consent Form Botulism Toxin Injection For Chronic Migraine    Reviewed orally with patient, additionally signature is on file:  Botulism toxin has been approved by the Federal drug administration for treatment of chronic migraine. Botulism toxin does not cure chronic migraine and it may not be effective in some patients.  The administration of botulism toxin is accomplished by injecting a small amount of toxin into the muscles of the neck and head. Dosage must be titrated for each individual. Any benefits resulting from botulism toxin tend to wear off after 3 months with a repeat injection required if benefit is to be maintained. Injections are usually done every 3-4 months with maximum effect peak achieved by about 2 or 3 weeks. Botulism toxin is expensive and you should be sure of what costs you will incur resulting from the injection.  The side effects of botulism toxin use for chronic migraine may include:   -Transient, and usually mild, facial weakness with facial injections  -Transient, and usually mild, head or neck weakness with head/neck injections  -Reduction or loss of forehead facial animation due to forehead muscle weakness  -Eyelid drooping  -Dry eye  -Pain at the site of injection or bruising at the site of injection  -Double vision  -Potential unknown long term risks   Contraindications: You should not have Botox if you are pregnant, nursing, allergic to albumin, have an infection, skin condition, or muscle weakness at the site of the injection, or have myasthenia gravis, Lambert-Eaton syndrome,  or ALS.  It is also possible that as with any injection, there may be an allergic reaction or no effect from the medication. Reduced effectiveness after repeated injections is sometimes seen and rarely infection at the injection site may occur. All care will be taken to prevent these side effects. If therapy is given over a long time, atrophy and wasting in the muscle injected may occur. Occasionally the patient's become refractory to treatment because they develop antibodies to the toxin. In this event, therapy needs to be modified.  I have read the above information and consent to the administration of botulism toxin.    BOTOX PROCEDURE NOTE FOR MIGRAINE HEADACHE  Contraindications and precautions discussed with patient(above). Aseptic procedure was observed and patient tolerated procedure. Procedure performed by Shawnie Dapper, FNP-C.   The condition has existed for more than 6 months, and pt does not have a diagnosis of ALS, Myasthenia Gravis or Lambert-Eaton Syndrome.  Risks and benefits of injections discussed and pt agrees to proceed with the procedure.  Written consent obtained  These injections are medically necessary. Pt  receives good benefits from these injections. These injections do not cause sedations or hallucinations which the oral therapies may cause.   Description of procedure:  The patient was placed in a sitting position. The standard protocol was used for Botox as follows, with 5 units of Botox injected at each site:  -Procerus muscle, midline injection  -Corrugator muscle, bilateral injection  -Frontalis muscle, bilateral injection, with 2 sites each side, medial injection was performed in the upper one third of the frontalis  muscle, in the region vertical from the medial inferior edge of the superior orbital rim. The lateral injection was again in the upper one third of the forehead vertically above the lateral limbus of the cornea, 1.5 cm lateral to the medial injection  site.  -Temporalis muscle injection, 4 sites, bilaterally. The first injection was 3 cm above the tragus of the ear, second injection site was 1.5 cm to 3 cm up from the first injection site in line with the tragus of the ear. The third injection site was 1.5-3 cm forward between the first 2 injection sites. The fourth injection site was 1.5 cm posterior to the second injection site. 5th site laterally in the temporalis  muscleat the level of the outer canthus.  -Occipitalis muscle injection, 3 sites, bilaterally. The first injection was done one half way between the occipital protuberance and the tip of the mastoid process behind the ear. The second injection site was done lateral and superior to the first, 1 fingerbreadth from the first injection. The third injection site was 1 fingerbreadth superiorly and medially from the first injection site.  -Cervical paraspinal muscle injection, 2 sites, bilaterally. The first injection site was 1 cm from the midline of the cervical spine, 3 cm inferior to the lower border of the occipital protuberance. The second injection site was 1.5 cm superiorly and laterally to the first injection site.  -Trapezius muscle injection was performed at 3 sites, bilaterally. The first injection site was in the upper trapezius muscle halfway between the inflection point of the neck, and the acromion. The second injection site was one half way between the acromion and the first injection site. The third injection was done between the first injection site and the inflection point of the neck.     Will return for repeat injection in 3 months.   A total of 155 units of Botox was used, 45U Botox not injected was wasted. The patient tolerated the procedure well, there were no complications of the above procedure.

## 2021-08-20 NOTE — Progress Notes (Signed)
Botox- 200 units x 1 vial Lot: [id number] Expiration: 12/2023 NDC: 1470-9295-74  Bacteriostatic 0.9% Sodium Chloride- 73mL total Lot: [id number] Expiration: 09/29/2022 NDC: 0964-3838-18  Dx: M03.754 S/P [facility]

## 2021-09-16 ENCOUNTER — Encounter: Payer: Self-pay | Admitting: Neurology

## 2021-10-15 ENCOUNTER — Telehealth: Payer: Self-pay | Admitting: Neurology

## 2021-10-15 NOTE — Telephone Encounter (Signed)
Patient has a Botox appointment 2/21. PA for Botox with [employer] expired 1/11. Submitted renewal request via fax.

## 2021-10-16 NOTE — Telephone Encounter (Signed)
Received PA from [employer] PA#: [id number] (10/15/2021-09/15/2022).

## 2021-11-19 ENCOUNTER — Ambulatory Visit (INDEPENDENT_AMBULATORY_CARE_PROVIDER_SITE_OTHER): Payer: BC Managed Care – PPO | Admitting: Neurology

## 2021-11-19 DIAGNOSIS — G43709 Chronic migraine without aura, not intractable, without status migrainosus: Secondary | ICD-10-CM

## 2021-11-19 NOTE — Progress Notes (Signed)
11/19/2021: Stable  08/09/2021 She continues Botox, topiramate and Relpax. She is doing well. Not having headaches. She rarely has migraines until the week prior to Botox.she sees lisa poulos, was the one who connected [place]. She has a son in [place] now working for the stadium and is going to see Lovena Le swift, she has another graduating next [employer]. Do the botox high in the forehead she has a heavy brow.  Consent Form Botulism Toxin Injection For Chronic Migraine    Reviewed orally with patient, additionally signature is on file:  Botulism toxin has been approved by the Federal drug administration for treatment of chronic migraine. Botulism toxin does not cure chronic migraine and it may not be effective in some patients.  The administration of botulism toxin is accomplished by injecting a small amount of toxin into the muscles of the neck and head. Dosage must be titrated for each individual. Any benefits resulting from botulism toxin tend to wear off after 3 months with a repeat injection required if benefit is to be maintained. Injections are usually done every 3-4 months with maximum effect peak achieved by about 2 or 3 weeks. Botulism toxin is expensive and you should be sure of what costs you will incur resulting from the injection.  The side effects of botulism toxin use for chronic migraine may include:   -Transient, and usually mild, facial weakness with facial injections  -Transient, and usually mild, head or neck weakness with head/neck injections  -Reduction or loss of forehead facial animation due to forehead muscle weakness  -Eyelid drooping  -Dry eye  -Pain at the site of injection or bruising at the site of injection  -Double vision  -Potential unknown long term risks   Contraindications: You should not have Botox if you are pregnant, nursing, allergic to albumin, have an infection, skin condition, or muscle weakness at the site of the injection, or have myasthenia gravis,  Lambert-Eaton syndrome, or ALS.  It is also possible that as with any injection, there may be an allergic reaction or no effect from the medication. Reduced effectiveness after repeated injections is sometimes seen and rarely infection at the injection site may occur. All care will be taken to prevent these side effects. If therapy is given over a long time, atrophy and wasting in the muscle injected may occur. Occasionally the patient's become refractory to treatment because they develop antibodies to the toxin. In this event, therapy needs to be modified.  I have read the above information and consent to the administration of botulism toxin.    BOTOX PROCEDURE NOTE FOR MIGRAINE HEADACHE  Contraindications and precautions discussed with patient(above). Aseptic procedure was observed and patient tolerated procedure. Procedure performed by Debbora Presto, FNP-C.   The condition has existed for more than 6 months, and pt does not have a diagnosis of ALS, Myasthenia Gravis or Lambert-Eaton Syndrome.  Risks and benefits of injections discussed and pt agrees to proceed with the procedure.  Written consent obtained  These injections are medically necessary. Pt  receives good benefits from these injections. These injections do not cause sedations or hallucinations which the oral therapies may cause.   Description of procedure:  The patient was placed in a sitting position. The standard protocol was used for Botox as follows, with 5 units of Botox injected at each site:  -Procerus muscle, midline injection  -Corrugator muscle, bilateral injection  -Frontalis muscle, bilateral injection, with 2 sites each side, medial injection was performed in the upper one third  of the frontalis muscle, in the region vertical from the medial inferior edge of the superior orbital rim. The lateral injection was again in the upper one third of the forehead vertically above the lateral limbus of the cornea, 1.5 cm lateral  to the medial injection site.  -Temporalis muscle injection, 4 sites, bilaterally. The first injection was 3 cm above the tragus of the ear, second injection site was 1.5 cm to 3 cm up from the first injection site in line with the tragus of the ear. The third injection site was 1.5-3 cm forward between the first 2 injection sites. The fourth injection site was 1.5 cm posterior to the second injection site. 5th site laterally in the temporalis  muscleat the level of the outer canthus.  -Occipitalis muscle injection, 3 sites, bilaterally. The first injection was done one half way between the occipital protuberance and the tip of the mastoid process behind the ear. The second injection site was done lateral and superior to the first, 1 fingerbreadth from the first injection. The third injection site was 1 fingerbreadth superiorly and medially from the first injection site.  -Cervical paraspinal muscle injection, 2 sites, bilaterally. The first injection site was 1 cm from the midline of the cervical spine, 3 cm inferior to the lower border of the occipital protuberance. The second injection site was 1.5 cm superiorly and laterally to the first injection site.  -Trapezius muscle injection was performed at 3 sites, bilaterally. The first injection site was in the upper trapezius muscle halfway between the inflection point of the neck, and the acromion. The second injection site was one half way between the acromion and the first injection site. The third injection was done between the first injection site and the inflection point of the neck.     Will return for repeat injection in 3 months.   A total of 155 units of Botox was used, 45U Botox not injected was wasted. The patient tolerated the procedure well, there were no complications of the above procedure.

## 2021-11-19 NOTE — Progress Notes (Signed)
Botox- 200 units x 1 vial Lot: [id number] Expiration: 05/2024 NDC: [id number]  0.9% Sodium Chloride- 38mL total Lot: [id number] Expiration: 10/30/2022 NDC: [id number]  Dx: [id number] B/B

## 2021-12-10 ENCOUNTER — Encounter: Payer: Self-pay | Admitting: Neurology

## 2022-01-05 ENCOUNTER — Encounter: Payer: Self-pay | Admitting: Neurology

## 2022-01-06 MED ORDER — TOPIRAMATE 100 MG PO TABS: 100.0000 mg | ORAL_TABLET | Freq: Every day | ORAL | 3 refills | Status: DC

## 2022-01-23 NOTE — Telephone Encounter (Signed)
Please send Botox Rx to [facility]. ?

## 2022-02-11 NOTE — Telephone Encounter (Signed)
Received approval from [facility] [place], [place] # [phone] DK (02/10/2022-05/152024). ?

## 2022-02-12 ENCOUNTER — Ambulatory Visit (INDEPENDENT_AMBULATORY_CARE_PROVIDER_SITE_OTHER): Payer: BC Managed Care – PPO | Admitting: Neurology

## 2022-02-12 DIAGNOSIS — G43709 Chronic migraine without aura, not intractable, without status migrainosus: Secondary | ICD-10-CM

## 2022-02-12 NOTE — Telephone Encounter (Signed)
Received (1) 200 unit vial of Botox from [facility]. 

## 2022-02-12 NOTE — Progress Notes (Signed)
02/17/2022: stable, >>90% improvement in migraine freq and severity  11/19/2021: Stable  08/09/2021 She continues Botox, topiramate and Relpax. She is doing well. Not having headaches. She rarely has migraines until the week prior to Botox.she sees lisa poulos, was the one who connected [place]. She has a son in [place] now working for the stadium and is going to see Lovena Le swift, she has another graduating next [employer]. Do the botox high in the forehead she has a heavy brow.  Consent Form Botulism Toxin Injection For Chronic Migraine    Reviewed orally with patient, additionally signature is on file:  Botulism toxin has been approved by the Federal drug administration for treatment of chronic migraine. Botulism toxin does not cure chronic migraine and it may not be effective in some patients.  The administration of botulism toxin is accomplished by injecting a small amount of toxin into the muscles of the neck and head. Dosage must be titrated for each individual. Any benefits resulting from botulism toxin tend to wear off after 3 months with a repeat injection required if benefit is to be maintained. Injections are usually done every 3-4 months with maximum effect peak achieved by about 2 or 3 weeks. Botulism toxin is expensive and you should be sure of what costs you will incur resulting from the injection.  The side effects of botulism toxin use for chronic migraine may include:   -Transient, and usually mild, facial weakness with facial injections  -Transient, and usually mild, head or neck weakness with head/neck injections  -Reduction or loss of forehead facial animation due to forehead muscle weakness  -Eyelid drooping  -Dry eye  -Pain at the site of injection or bruising at the site of injection  -Double vision  -Potential unknown long term risks   Contraindications: You should not have Botox if you are pregnant, nursing, allergic to albumin, have an infection, skin condition, or muscle  weakness at the site of the injection, or have myasthenia gravis, Lambert-Eaton syndrome, or ALS.  It is also possible that as with any injection, there may be an allergic reaction or no effect from the medication. Reduced effectiveness after repeated injections is sometimes seen and rarely infection at the injection site may occur. All care will be taken to prevent these side effects. If therapy is given over a long time, atrophy and wasting in the muscle injected may occur. Occasionally the patient's become refractory to treatment because they develop antibodies to the toxin. In this event, therapy needs to be modified.  I have read the above information and consent to the administration of botulism toxin.    BOTOX PROCEDURE NOTE FOR MIGRAINE HEADACHE  Contraindications and precautions discussed with patient(above). Aseptic procedure was observed and patient tolerated procedure. Procedure performed by Debbora Presto, FNP-C.   The condition has existed for more than 6 months, and pt does not have a diagnosis of ALS, Myasthenia Gravis or Lambert-Eaton Syndrome.  Risks and benefits of injections discussed and pt agrees to proceed with the procedure.  Written consent obtained  These injections are medically necessary. Pt  receives good benefits from these injections. These injections do not cause sedations or hallucinations which the oral therapies may cause.   Description of procedure:  The patient was placed in a sitting position. The standard protocol was used for Botox as follows, with 5 units of Botox injected at each site:  -Procerus muscle, midline injection  -Corrugator muscle, bilateral injection  -Frontalis muscle, bilateral injection, with 2 sites each  side, medial injection was performed in the upper one third of the frontalis muscle, in the region vertical from the medial inferior edge of the superior orbital rim. The lateral injection was again in the upper one third of the forehead  vertically above the lateral limbus of the cornea, 1.5 cm lateral to the medial injection site.  -Temporalis muscle injection, 4 sites, bilaterally. The first injection was 3 cm above the tragus of the ear, second injection site was 1.5 cm to 3 cm up from the first injection site in line with the tragus of the ear. The third injection site was 1.5-3 cm forward between the first 2 injection sites. The fourth injection site was 1.5 cm posterior to the second injection site. 5th site laterally in the temporalis  muscleat the level of the outer canthus.  -Occipitalis muscle injection, 3 sites, bilaterally. The first injection was done one half way between the occipital protuberance and the tip of the mastoid process behind the ear. The second injection site was done lateral and superior to the first, 1 fingerbreadth from the first injection. The third injection site was 1 fingerbreadth superiorly and medially from the first injection site.  -Cervical paraspinal muscle injection, 2 sites, bilaterally. The first injection site was 1 cm from the midline of the cervical spine, 3 cm inferior to the lower border of the occipital protuberance. The second injection site was 1.5 cm superiorly and laterally to the first injection site.  -Trapezius muscle injection was performed at 3 sites, bilaterally. The first injection site was in the upper trapezius muscle halfway between the inflection point of the neck, and the acromion. The second injection site was one half way between the acromion and the first injection site. The third injection was done between the first injection site and the inflection point of the neck.     Will return for repeat injection in 3 months.   A total of 155 units of Botox was used, 45U Botox not injected was wasted. The patient tolerated the procedure well, there were no complications of the above procedure.

## 2022-02-12 NOTE — Progress Notes (Signed)
Botox- 200 units x 1 vial Lot: [id number] Expiration: 06/2024 NDC: 0023-3921-02  Bacteriostatic 0.9% Sodium Chloride- 4mL total Lot: [id number] Expiration: 04/30/2023 NDC: 0409-1966-02  Dx: G43.709 S/P  

## 2022-02-27 ENCOUNTER — Encounter: Payer: Self-pay | Admitting: Neurology

## 2022-03-03 ENCOUNTER — Ambulatory Visit (INDEPENDENT_AMBULATORY_CARE_PROVIDER_SITE_OTHER): Payer: Self-pay | Admitting: Neurology

## 2022-03-03 ENCOUNTER — Encounter: Payer: Self-pay | Admitting: Neurology

## 2022-03-03 DIAGNOSIS — G43709 Chronic migraine without aura, not intractable, without status migrainosus: Secondary | ICD-10-CM

## 2022-03-03 NOTE — Progress Notes (Signed)
Botox- 100 units x 1 vials Lot: [id number] Expiration: 05/2024 NDC: 4259-5638-75  Bacteriostatic 0.9% Sodium Chloride- 27mL total Lot: [id number] Expiration: 04/30/2023 NDC: 5188-4166-06  Dx: T01.601 SAMPLE

## 2022-03-03 NOTE — Patient Instructions (Addendum)
No charge, left eyebrow was uneven, fixed with a few units of botox, also put extra around the eyes and right temple area, there is stress she has an upcoming wedding of her son and migraines are slightly worse this time around. No charge.

## 2022-05-01 ENCOUNTER — Encounter: Payer: Self-pay | Admitting: Neurology

## 2022-05-08 ENCOUNTER — Telehealth: Payer: Self-pay | Admitting: Neurology

## 2022-05-08 MED ORDER — BOTOX 100 UNITS IJ SOLR: INTRAMUSCULAR | 3 refills | Status: DC

## 2022-05-08 NOTE — Telephone Encounter (Signed)
I placed order for botox (for future refills) to [facility].

## 2022-05-08 NOTE — Telephone Encounter (Signed)
[facility] [employer] (10/15/2021-09/15/2022) [facility] # [phone] DK (02/10/2022-05/152024) [facility] [phone])  I called [facility] to set up delivery for her Botox that her appointment is scheduled on Tuesday 04/1522.  However, they informed me that they needed a new prescription because the last one expires in May and I was able to do a verbal prescription for the Botox.  But they informed me it would take 48-72 hours before I could actually schedule the delivery.

## 2022-05-12 ENCOUNTER — Other Ambulatory Visit: Payer: Self-pay | Admitting: *Deleted

## 2022-05-12 MED ORDER — BOTOX 100 UNITS IJ SOLR: INTRAMUSCULAR | 3 refills | Status: DC

## 2022-05-12 NOTE — Telephone Encounter (Signed)
We had received a fax stating to send Botox Rx to [facility] and this was completed. However review of chart shows [facility] has Rx and it scheduled to be shipped to the office. I called [facility] and spoke with Katie. Discontinued Botox prescription at [facility].

## 2022-05-13 ENCOUNTER — Ambulatory Visit: Payer: BC Managed Care – PPO | Admitting: Neurology

## 2022-05-13 NOTE — Telephone Encounter (Signed)
Late entry from 05/12/22. I spoke with patient and [facility]. Pt provided consent and medication was shipped to our office via overnight air.   05/13/2022 Botox received today from [facility].

## 2022-05-26 ENCOUNTER — Encounter: Payer: Self-pay | Admitting: Neurology

## 2022-05-26 ENCOUNTER — Ambulatory Visit (INDEPENDENT_AMBULATORY_CARE_PROVIDER_SITE_OTHER): Payer: BC Managed Care – PPO | Admitting: Neurology

## 2022-05-26 DIAGNOSIS — G43709 Chronic migraine without aura, not intractable, without status migrainosus: Secondary | ICD-10-CM | POA: Diagnosis not present

## 2022-05-26 MED ORDER — ELETRIPTAN HYDROBROMIDE 40 MG PO TABS: 40.0000 mg | ORAL_TABLET | ORAL | 11 refills | Status: DC | PRN

## 2022-05-26 MED ORDER — ONDANSETRON 4 MG PO TBDP
4.0000 mg | ORAL_TABLET | Freq: Three times a day (TID) | ORAL | 3 refills | Status: DC | PRN
Start: 2022-05-26 — End: 2024-05-10

## 2022-05-26 MED ORDER — ONABOTULINUMTOXINA 100 UNITS IJ SOLR
155.0000 [IU] | Freq: Once | INTRAMUSCULAR | Status: AC
  Administered 2022-05-26: 155 [IU] via INTRAMUSCULAR

## 2022-05-26 MED ORDER — TOPIRAMATE 100 MG PO TABS: 100.0000 mg | ORAL_TABLET | Freq: Every day | ORAL | 3 refills | Status: DC

## 2022-05-26 NOTE — Patient Instructions (Signed)
05/26/2022: Stable  02/17/2022: stable, >>90% improvement in migraine freq and severity  11/19/2021: Stable  08/09/2021 She continues Botox, topiramate and Relpax. She is doing well. Not having headaches. She rarely has migraines until the week prior to Botox.she sees lisa poulos, was the one who connected [place]. She has a son in [place] now working for the stadium and is going to see [name], she has another graduating next [employer]. Do the botox high in the forehead she has a heavy brow.  Consent Form Botulism Toxin Injection For Chronic Migraine    Reviewed orally with patient, additionally signature is on file:  Botulism toxin has been approved by the Federal drug administration for treatment of chronic migraine. Botulism toxin does not cure chronic migraine and it may not be effective in some patients.  The administration of botulism toxin is accomplished by injecting a small amount of toxin into the muscles of the neck and head. Dosage must be titrated for each individual. Any benefits resulting from botulism toxin tend to wear off after 3 months with a repeat injection required if benefit is to be maintained. Injections are usually done every 3-4 months with maximum effect peak achieved by about 2 or 3 weeks. Botulism toxin is expensive and you should be sure of what costs you will incur resulting from the injection.  The side effects of botulism toxin use for chronic migraine may include:   -Transient, and usually mild, facial weakness with facial injections  -Transient, and usually mild, head or neck weakness with head/neck injections  -Reduction or loss of forehead facial animation due to forehead muscle weakness  -Eyelid drooping  -Dry eye  -Pain at the site of injection or bruising at the site of injection  -Double vision  -Potential unknown long term risks   Contraindications: You should not have Botox if you are pregnant, nursing, allergic to albumin, have an infection, skin  condition, or muscle weakness at the site of the injection, or have myasthenia gravis, Lambert-Eaton syndrome, or ALS.  It is also possible that as with any injection, there may be an allergic reaction or no effect from the medication. Reduced effectiveness after repeated injections is sometimes seen and rarely infection at the injection site may occur. All care will be taken to prevent these side effects. If therapy is given over a long time, atrophy and wasting in the muscle injected may occur. Occasionally the patient's become refractory to treatment because they develop antibodies to the toxin. In this event, therapy needs to be modified.  I have read the above information and consent to the administration of botulism toxin.    BOTOX PROCEDURE NOTE FOR MIGRAINE HEADACHE  Contraindications and precautions discussed with patient(above). Aseptic procedure was observed and patient tolerated procedure. Procedure performed by Shawnie Dapper, FNP-C.   The condition has existed for more than 6 months, and pt does not have a diagnosis of ALS, Myasthenia Gravis or Lambert-Eaton Syndrome.  Risks and benefits of injections discussed and pt agrees to proceed with the procedure.  Written consent obtained  These injections are medically necessary. Pt  receives good benefits from these injections. These injections do not cause sedations or hallucinations which the oral therapies may cause.   Description of procedure:  The patient was placed in a sitting position. The standard protocol was used for Botox as follows, with 5 units of Botox injected at each site:  -Procerus muscle, midline injection  -Corrugator muscle, bilateral injection  -Frontalis muscle, bilateral injection, with  2 sites each side, medial injection was performed in the upper one third of the frontalis muscle, in the region vertical from the medial inferior edge of the superior orbital rim. The lateral injection was again in the upper one  third of the forehead vertically above the lateral limbus of the cornea, 1.5 cm lateral to the medial injection site.  -Temporalis muscle injection, 4 sites, bilaterally. The first injection was 3 cm above the tragus of the ear, second injection site was 1.5 cm to 3 cm up from the first injection site in line with the tragus of the ear. The third injection site was 1.5-3 cm forward between the first 2 injection sites. The fourth injection site was 1.5 cm posterior to the second injection site. 5th site laterally in the temporalis  muscleat the level of the outer canthus.  -Occipitalis muscle injection, 3 sites, bilaterally. The first injection was done one half way between the occipital protuberance and the tip of the mastoid process behind the ear. The second injection site was done lateral and superior to the first, 1 fingerbreadth from the first injection. The third injection site was 1 fingerbreadth superiorly and medially from the first injection site.  -Cervical paraspinal muscle injection, 2 sites, bilaterally. The first injection site was 1 cm from the midline of the cervical spine, 3 cm inferior to the lower border of the occipital protuberance. The second injection site was 1.5 cm superiorly and laterally to the first injection site.  -Trapezius muscle injection was performed at 3 sites, bilaterally. The first injection site was in the upper trapezius muscle halfway between the inflection point of the neck, and the acromion. The second injection site was one half way between the acromion and the first injection site. The third injection was done between the first injection site and the inflection point of the neck.     Will return for repeat injection in 3 months.   A total of 155 units of Botox was used, 45U Botox not injected was wasted. The patient tolerated the procedure well, there were no complications of the above procedure.

## 2022-05-26 NOTE — Progress Notes (Signed)
Botox- 100 units x 2 vials Lot: [id number] Expiration: 05/2024 NDC: 6237-6283-15  Bacteriostatic 0.9% Sodium Chloride- 71mL total Lot: [id number] Expiration: 04/30/2023 NDC: 7371-0626-94  Dx: W54.627 S/P

## 2022-05-26 NOTE — Progress Notes (Signed)
05/26/2022: stable:   02/17/2022: stable, >>90% improvement in migraine freq and severity  11/19/2021: Stable  08/09/2021 She continues Botox, topiramate and Relpax. She is doing well. Not having headaches. She rarely has migraines until the week prior to Botox.she sees lisa poulos, was the one who connected [place]. She has a son in [place] now working for the stadium and is going to see [name], she has another graduating next [employer]. Do the botox high in the forehead she has a heavy brow.  Consent Form Botulism Toxin Injection For Chronic Migraine    Reviewed orally with patient, additionally signature is on file:  Botulism toxin has been approved by the Federal drug administration for treatment of chronic migraine. Botulism toxin does not cure chronic migraine and it may not be effective in some patients.  The administration of botulism toxin is accomplished by injecting a small amount of toxin into the muscles of the neck and head. Dosage must be titrated for each individual. Any benefits resulting from botulism toxin tend to wear off after 3 months with a repeat injection required if benefit is to be maintained. Injections are usually done every 3-4 months with maximum effect peak achieved by about 2 or 3 weeks. Botulism toxin is expensive and you should be sure of what costs you will incur resulting from the injection.  The side effects of botulism toxin use for chronic migraine may include:   -Transient, and usually mild, facial weakness with facial injections  -Transient, and usually mild, head or neck weakness with head/neck injections  -Reduction or loss of forehead facial animation due to forehead muscle weakness  -Eyelid drooping  -Dry eye  -Pain at the site of injection or bruising at the site of injection  -Double vision  -Potential unknown long term risks   Contraindications: You should not have Botox if you are pregnant, nursing, allergic to albumin, have an infection,  skin condition, or muscle weakness at the site of the injection, or have myasthenia gravis, Lambert-Eaton syndrome, or ALS.  It is also possible that as with any injection, there may be an allergic reaction or no effect from the medication. Reduced effectiveness after repeated injections is sometimes seen and rarely infection at the injection site may occur. All care will be taken to prevent these side effects. If therapy is given over a long time, atrophy and wasting in the muscle injected may occur. Occasionally the patient's become refractory to treatment because they develop antibodies to the toxin. In this event, therapy needs to be modified.  I have read the above information and consent to the administration of botulism toxin.    BOTOX PROCEDURE NOTE FOR MIGRAINE HEADACHE  Contraindications and precautions discussed with patient(above). Aseptic procedure was observed and patient tolerated procedure. Procedure performed by Shawnie Dapper, FNP-C.   The condition has existed for more than 6 months, and pt does not have a diagnosis of ALS, Myasthenia Gravis or Lambert-Eaton Syndrome.  Risks and benefits of injections discussed and pt agrees to proceed with the procedure.  Written consent obtained  These injections are medically necessary. Pt  receives good benefits from these injections. These injections do not cause sedations or hallucinations which the oral therapies may cause.   Description of procedure:  The patient was placed in a sitting position. The standard protocol was used for Botox as follows, with 5 units of Botox injected at each site:  -Procerus muscle, midline injection  -Corrugator muscle, bilateral injection  -Frontalis muscle, bilateral  injection, with 2 sites each side, medial injection was performed in the upper one third of the frontalis muscle, in the region vertical from the medial inferior edge of the superior orbital rim. The lateral injection was again in the upper  one third of the forehead vertically above the lateral limbus of the cornea, 1.5 cm lateral to the medial injection site.  -Temporalis muscle injection, 4 sites, bilaterally. The first injection was 3 cm above the tragus of the ear, second injection site was 1.5 cm to 3 cm up from the first injection site in line with the tragus of the ear. The third injection site was 1.5-3 cm forward between the first 2 injection sites. The fourth injection site was 1.5 cm posterior to the second injection site. 5th site laterally in the temporalis  muscleat the level of the outer canthus.  -Occipitalis muscle injection, 3 sites, bilaterally. The first injection was done one half way between the occipital protuberance and the tip of the mastoid process behind the ear. The second injection site was done lateral and superior to the first, 1 fingerbreadth from the first injection. The third injection site was 1 fingerbreadth superiorly and medially from the first injection site.  -Cervical paraspinal muscle injection, 2 sites, bilaterally. The first injection site was 1 cm from the midline of the cervical spine, 3 cm inferior to the lower border of the occipital protuberance. The second injection site was 1.5 cm superiorly and laterally to the first injection site.  -Trapezius muscle injection was performed at 3 sites, bilaterally. The first injection site was in the upper trapezius muscle halfway between the inflection point of the neck, and the acromion. The second injection site was one half way between the acromion and the first injection site. The third injection was done between the first injection site and the inflection point of the neck.     Will return for repeat injection in 3 months.   A total of 155 units of Botox was used, 45U Botox not injected was wasted. The patient tolerated the procedure well, there were no complications of the above procedure.

## 2022-08-26 ENCOUNTER — Ambulatory Visit (INDEPENDENT_AMBULATORY_CARE_PROVIDER_SITE_OTHER): Payer: BC Managed Care – PPO | Admitting: Neurology

## 2022-08-26 DIAGNOSIS — G43709 Chronic migraine without aura, not intractable, without status migrainosus: Secondary | ICD-10-CM | POA: Diagnosis not present

## 2022-08-26 MED ORDER — ONABOTULINUMTOXINA 200 UNITS IJ SOLR
155.0000 [IU] | Freq: Once | INTRAMUSCULAR | Status: AC
  Administered 2022-08-26: 155 [IU] via INTRAMUSCULAR

## 2022-08-26 MED ORDER — NURTEC 75 MG PO TBDP: 75.0000 mg | ORAL_TABLET | Freq: Every day | ORAL | 0 refills | Status: DC | PRN

## 2022-08-26 NOTE — Progress Notes (Signed)
08/26/2022: stable doing great cut topamax in 1/2 try nurtec - gave sample  05/26/2022: stable:   02/17/2022: stable, >>90% improvement in migraine freq and severity  11/19/2021: Stable  08/09/2021 She continues Botox, topiramate and Relpax. She is doing well. Not having headaches. She rarely has migraines until the week prior to Botox.she sees lisa poulos, was the one who connected [place]. She has a son in [place] now working for the stadium and is going to see [name], she has another graduating next [employer]. Do the botox high in the forehead she has a heavy brow.  Consent Form Botulism Toxin Injection For Chronic Migraine    Reviewed orally with patient, additionally signature is on file:  Botulism toxin has been approved by the Federal drug administration for treatment of chronic migraine. Botulism toxin does not cure chronic migraine and it may not be effective in some patients.  The administration of botulism toxin is accomplished by injecting a small amount of toxin into the muscles of the neck and head. Dosage must be titrated for each individual. Any benefits resulting from botulism toxin tend to wear off after 3 months with a repeat injection required if benefit is to be maintained. Injections are usually done every 3-4 months with maximum effect peak achieved by about 2 or 3 weeks. Botulism toxin is expensive and you should be sure of what costs you will incur resulting from the injection.  The side effects of botulism toxin use for chronic migraine may include:   -Transient, and usually mild, facial weakness with facial injections  -Transient, and usually mild, head or neck weakness with head/neck injections  -Reduction or loss of forehead facial animation due to forehead muscle weakness  -Eyelid drooping  -Dry eye  -Pain at the site of injection or bruising at the site of injection  -Double vision  -Potential unknown long term risks   Contraindications: You should not have  Botox if you are pregnant, nursing, allergic to albumin, have an infection, skin condition, or muscle weakness at the site of the injection, or have myasthenia gravis, Lambert-Eaton syndrome, or ALS.  It is also possible that as with any injection, there may be an allergic reaction or no effect from the medication. Reduced effectiveness after repeated injections is sometimes seen and rarely infection at the injection site may occur. All care will be taken to prevent these side effects. If therapy is given over a long time, atrophy and wasting in the muscle injected may occur. Occasionally the patient's become refractory to treatment because they develop antibodies to the toxin. In this event, therapy needs to be modified.  I have read the above information and consent to the administration of botulism toxin.    BOTOX PROCEDURE NOTE FOR MIGRAINE HEADACHE  Contraindications and precautions discussed with patient(above). Aseptic procedure was observed and patient tolerated procedure. Procedure performed by Shawnie Dapper, FNP-C.   The condition has existed for more than 6 months, and pt does not have a diagnosis of ALS, Myasthenia Gravis or Lambert-Eaton Syndrome.  Risks and benefits of injections discussed and pt agrees to proceed with the procedure.  Written consent obtained  These injections are medically necessary. Pt  receives good benefits from these injections. These injections do not cause sedations or hallucinations which the oral therapies may cause.   Description of procedure:  The patient was placed in a sitting position. The standard protocol was used for Botox as follows, with 5 units of Botox injected at each site:  -  Procerus muscle, midline injection  -Corrugator muscle, bilateral injection  -Frontalis muscle, bilateral injection, with 2 sites each side, medial injection was performed in the upper one third of the frontalis muscle, in the region vertical from the medial inferior edge  of the superior orbital rim. The lateral injection was again in the upper one third of the forehead vertically above the lateral limbus of the cornea, 1.5 cm lateral to the medial injection site.  -Temporalis muscle injection, 4 sites, bilaterally. The first injection was 3 cm above the tragus of the ear, second injection site was 1.5 cm to 3 cm up from the first injection site in line with the tragus of the ear. The third injection site was 1.5-3 cm forward between the first 2 injection sites. The fourth injection site was 1.5 cm posterior to the second injection site. 5th site laterally in the temporalis  muscleat the level of the outer canthus.  -Occipitalis muscle injection, 3 sites, bilaterally. The first injection was done one half way between the occipital protuberance and the tip of the mastoid process behind the ear. The second injection site was done lateral and superior to the first, 1 fingerbreadth from the first injection. The third injection site was 1 fingerbreadth superiorly and medially from the first injection site.  -Cervical paraspinal muscle injection, 2 sites, bilaterally. The first injection site was 1 cm from the midline of the cervical spine, 3 cm inferior to the lower border of the occipital protuberance. The second injection site was 1.5 cm superiorly and laterally to the first injection site.  -Trapezius muscle injection was performed at 3 sites, bilaterally. The first injection site was in the upper trapezius muscle halfway between the inflection point of the neck, and the acromion. The second injection site was one half way between the acromion and the first injection site. The third injection was done between the first injection site and the inflection point of the neck.     Will return for repeat injection in 3 months.   A total of 155 units of Botox was used, 45U Botox not injected was wasted. The patient tolerated the procedure well, there were no complications of  the above procedure.

## 2022-08-26 NOTE — Progress Notes (Signed)
Botox- 200 units x 1 vial Lot: [id number]  Expiration: 10/2024 NDC: 0626-9485-46   Bacteriostatic 0.9% Sodium Chloride- 80mL total Lot: [id number] Expiration: 05/31/2023 NDC: 0938-1829-93  Dx: [id number] S/P

## 2022-09-15 ENCOUNTER — Telehealth (INDEPENDENT_AMBULATORY_CARE_PROVIDER_SITE_OTHER): Payer: BC Managed Care – PPO | Admitting: Neurology

## 2022-09-15 ENCOUNTER — Encounter: Payer: Self-pay | Admitting: Neurology

## 2022-09-15 DIAGNOSIS — G43009 Migraine without aura, not intractable, without status migrainosus: Secondary | ICD-10-CM | POA: Diagnosis not present

## 2022-09-15 DIAGNOSIS — G43709 Chronic migraine without aura, not intractable, without status migrainosus: Secondary | ICD-10-CM

## 2022-09-15 MED ORDER — NURTEC 75 MG PO TBDP: 75.0000 mg | ORAL_TABLET | Freq: Every day | ORAL | 11 refills | Status: DC | PRN

## 2022-09-15 NOTE — Patient Instructions (Signed)
Stay on Topiramate 50mg  At onset of migraine: Relpax, zofran and nurtec. Repeat Relpax and zofran 2 hours later At next botox we can talk some more about how you are doing  Rimegepant Disintegrating Tablets What is this medication? RIMEGEPANT (ri ME je pant) prevents and treats migraines. It works by blocking a substance in the body that causes migraines. This medicine may be used for other purposes; ask your health care provider or pharmacist if you have questions. COMMON BRAND NAME(S): NURTEC ODT What should I tell my care team before I take this medication? They need to know if you have any of these conditions: Kidney disease Liver disease An unusual or allergic reaction to rimegepant, other medications, foods, dyes, or preservatives Pregnant or trying to get pregnant Breast-feeding How should I use this medication? Take this medication by mouth. Take it as directed on the prescription label. Leave the tablet in the sealed pack until you are ready to take it. With dry hands, open the pack and gently remove the tablet. If the tablet breaks or crumbles, throw it away. Use a new tablet. Place the tablet in the mouth and allow it to dissolve. Then, swallow it. Do not cut, crush, or chew this medication. You do not need water to take this medication. Talk to your care team about the use of this medication in children. Special care may be needed. Overdosage: If you think you have taken too much of this medicine contact a poison control center or emergency room at once. NOTE: This medicine is only for you. Do not share this medicine with others. What if I miss a dose? This does not apply. This medication is not for regular use. What may interact with this medication? Certain medications for fungal infections, such as fluconazole, itraconazole Rifampin This list may not describe all possible interactions. Give your health care provider a list of all the medicines, herbs, non-prescription drugs,  or dietary supplements you use. Also tell them if you smoke, drink alcohol, or use illegal drugs. Some items may interact with your medicine. What should I watch for while using this medication? Visit your care team for regular checks on your progress. Tell your care team if your symptoms do not start to get better or if they get worse. What side effects may I notice from receiving this medication? Side effects that you should report to your care team as soon as possible: Allergic reactions--skin rash, itching, hives, swelling of the face, lips, tongue, or throat Side effects that usually do not require medical attention (report to your care team if they continue or are bothersome): Nausea Stomach pain This list may not describe all possible side effects. Call your doctor for medical advice about side effects. You may report side effects to FDA at 1-800-FDA-1088. Where should I keep my medication? Keep out of the reach of children and pets. Store at room temperature between 20 and 25 degrees C (68 and 77 degrees F). Get rid of any unused medication after the expiration date. To get rid of medications that are no longer needed or have expired: Take the medication to a medication take-back program. Check with your pharmacy or law enforcement to find a location. If you cannot return the medication, check the label or package insert to see if the medication should be thrown out in the garbage or flushed down the toilet. If you are not sure, ask your care team. If it is safe to put it in the trash, take the  medication out of the container. Mix the medication with cat litter, dirt, coffee grounds, or other unwanted substance. Seal the mixture in a bag or container. Put it in the trash. NOTE: This sheet is a summary. It may not cover all possible information. If you have questions about this medicine, talk to your doctor, pharmacist, or health care provider.  2023 Elsevier/Gold Standard (2021-10-03  00:00:00)

## 2022-09-15 NOTE — Progress Notes (Signed)
[facility]    Provider:  Dr Lucia Gaskins Requesting Provider: [facility]* Primary Care Provider:  [facility]  CC:  migraines  Virtual Visit via Video Note  I connected with [name] on 09/15/22 at  3:00 PM EST by a video enabled telemedicine application and verified that I am speaking with the correct person using two identifiers.  Location: Patient: home Provider: office   I discussed the limitations of evaluation and management by telemedicine and the availability of in person appointments. The patient expressed understanding and agreed to proceed.   Follow Up Instructions:    I discussed the assessment and treatment plan with the patient. The patient was provided an opportunity to ask questions and all were answered. The patient agreed with the plan and demonstrated an understanding of the instructions.   The patient was advised to call back or seek an in-person evaluation if the symptoms worsen or if the condition fails to improve as anticipated.  I provided 30 minutes of non-face-to-face time during this encounter.   Anson Fret, MD   Follow up:   CHRISHONDA Simpson is a 46 y.o. female here as requested by [facility] Petit* for migraines. Getting botox and doing very well here for years. At baseline was having >15 headache days a month and > 8-10 moderate to severe migraine days a month and milder migraines as well. We initiated Botox on top of her other medications and it went down to >70% improvement in freq and severity of migraines and headaches. She also stopped alcohol. She has been on Topamax for years and we decreased it by 1/2 on 11/28. She take nurtec and relpax and it helps. She has 4 migraine days a month and < 10 total headache days a month failed imitrex, maxalt, relpax.  Meds ordered this encounter  Medications   Rimegepant Sulfate (NURTEC) 75 MG TBDP    Sig: Take 75 mg by mouth  daily as needed. For migraines. Take as close to onset of migraine as possible. One daily maximum.    Dispense:  16 tablet    Refill:  11    Has 4 migraine days a month and < 10 total headache days a month failed imitrex, maxalt, relpax.   Current and past medications reviewed EOIC and CARE EVERYWHERE and prior neuro notes ANALGESICS: tylenol, tylenol #3, Darvocet, Excedrin, Hydrocodone, Tylenol, ibuprofen, mobic, tramadol ANTI-MIGRAINE: imitrex tablet, relpax, Maxalt HEART/BP: atenolol DECONGESTANT/ANTIHISTAMINE: Benadryl, Flonase, Sudafed ANTI-NAUSEANT: Dramamine, Phenergan, Zofran NSAIDS: ibuprofen, aleve, mobic MUSCLE RELAXANTS: Flexeril, Skelaxin, Baclofen, chlorzoxazone (not helpful), methocarbamol (slightly more helpful than chlorzoxazone) ANTI-CONVULSANTS: Lyrica (no adverse effects, would re-try), Topamax (cognitive),gabapentin, lamictal STEROIDS: Decadron SLEEPING PILLS/TRANQUILIZERS: ANTI-DEPRESSANTS: Wellbutrin, Zoloft, Cymbalta, Effexor, Pristiq, amitriptyline HERBAL: Mg, B2 FIBROMYALGIA:  HORMONAL: OTHER: ma, b2 PROCEDURES FOR HEADACHES: botox, Aimovig, Ubrelvy, Nurtec   Review of Systems: Patient complains of symptoms per HPI as well as the following symptoms migraines. Pertinent negatives and positives per HPI. All others negative.   Social History   Socioeconomic History   Marital status: Widowed    Spouse name: Not on file   Number of children: 2   Years of education: college   Highest education level: Not on file  Occupational History   Occupation: Teacher  Tobacco Use   Smoking status: Former    Packs/day: 0.50    Types: Cigarettes    Quit date: 03/18/1997    Years since quitting: 25.5   Smokeless tobacco: Never  Vaping Use   Vaping Use: Never used  Substance and Sexual Activity   Alcohol use: Yes    Comment: rarely   Drug use: No   Sexual activity: Yes    Birth control/protection: I.U.D.  Other Topics Concern   Not on file  Social History  Narrative   Lives at home with her older son.   Right-handed.   1-2 cups caffeine daily.   Social Determinants of Health   Financial Resource Strain: Not on file  Food Insecurity: Not on file  Transportation Needs: Not on file  Physical Activity: Not on file  Stress: Not on file  Social Connections: Not on file  Intimate Partner Violence: Not on file    Family History  Problem Relation Age of Onset   Hypertension Mother    Hyperlipidemia Father    Rheum arthritis Father    Hypertension Paternal Grandmother    Heart failure Paternal Grandmother     Past Medical History:  Diagnosis Date   Anxiety    Back pain    Cervical pain    Complication of anesthesia    headaches and BP drops at times   Depression    Fibromyalgia    GERD (gastroesophageal reflux disease)    IBS (irritable bowel syndrome)    Migraines     Patient Active Problem List   Diagnosis Date Noted   Migraine without aura and without status migrainosus, not intractable 09/15/2022   Admission for adjustment of gastric lap band 03/23/2014   Gastric band slippage 03/22/2014   surgical takedown of plication and anterior slip and replication June 2015 03/22/2014   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 repair April 2009 11/02/2012    Past Surgical History:  Procedure Laterality Date   BACK SURGERY     disc rupture and fusion   CARPAL TUNNEL RELEASE Right 04/2017   INTRAUTERINE DEVICE INSERTION     LAPAROSCOPIC GASTRIC BANDING  12/30/2007   LAPAROSCOPIC REVISION OF GASTRIC BAND N/A 03/22/2014   Procedure: LAPAROSCOPIC REVISION OF GASTRIC BAND;  Surgeon: Valarie Merino, MD;  Location: [facility];  Service: General;  Laterality: N/A;   left knee surgery     lateral release    SPINE SURGERY     TONSILLECTOMY     TYMPANOSTOMY TUBE PLACEMENT     UPPER GI ENDOSCOPY  03/22/2014   Procedure: UPPER GI ENDOSCOPY;  Surgeon:  Valarie Merino, MD;  Location: [facility];  Service: General;;    Current Outpatient Medications  Medication Sig Dispense Refill   Rimegepant Sulfate (NURTEC) 75 MG TBDP Take 75 mg by mouth daily as needed. For migraines. Take as close to onset of migraine as possible. One daily maximum. 16 tablet 11   botulinum toxin Type A (BOTOX) 100 units SOLR injection Inject 155 units into the muscles of the head and neck. To be injected by the provider. Discard remainder. 2 each 3   clonazePAM (KLONOPIN) 0.5 MG tablet Take 0.25-0.5 mg by mouth 2 (two) times daily as needed for anxiety.     desvenlafaxine (PRISTIQ) 50 MG 24 hr tablet Take 1 tablet by mouth daily. 100mg  tablet  and 50 mg    daily     eletriptan (RELPAX) 40 MG tablet Take 1 tablet (40 mg total) by mouth as needed. may repeat in 2 hours if necessary 10 tablet 11   lamoTRIgine (LAMICTAL) 150 MG  tablet Take 150 mg by mouth daily.     melatonin 1 MG TABS tablet Take 1 tablet at bedtime by mouth.     Multiple Vitamin (MULTIVITAMIN) tablet Take 1 tablet by mouth daily.     ondansetron (ZOFRAN-ODT) 4 MG disintegrating tablet Take 1 tablet (4 mg total) by mouth every 8 (eight) hours as needed for nausea. 30 tablet 3   topiramate (TOPAMAX) 100 MG tablet Take 1 tablet (100 mg total) by mouth daily. 90 tablet 3   No current facility-administered medications for this visit.    Allergies as of 09/15/2022 - Review Complete 08/26/2022  Allergen Reaction Noted   Ciprofloxacin Shortness Of Breath and Rash 03/21/2014   Shellfish allergy Hives and Shortness Of Breath 04/08/2017   Other  10/10/2020   Latex Rash 04/08/2017    Vitals: There were no vitals taken for this visit. Last Weight:  Wt Readings from Last 1 Encounters:  07/26/18 181 lb (82.1 kg)   Last Height:   Ht Readings from Last 1 Encounters:  07/26/18 5\' 4"  (1.626 m)    Physical exam: Exam: Gen: NAD, conversant      CV:  Denies palpitations or chest pain or SOB. VS: Breathing at a  normal rate. Not febrile. Eyes: Conjunctivae clear without exudates or hemorrhage  Neuro: Detailed Neurologic Exam  Speech:    Speech is normal; fluent and spontaneous with normal comprehension.  Cognition:    The patient is oriented to person, place, and time;     recent and remote memory intact;     language fluent;     normal attention, concentration,     fund of knowledge Cranial Nerves:    The pupils are equal, round, and reactive to light. Visual fields are full to finger confrontation. Extraocular movements are intact.  The face is symmetric with normal sensation. The palate elevates in the midline. Hearing intact. Voice is normal. Shoulder shrug is normal. The tongue has normal motion without fasciculations.   Coordination:    Normal finger to nose  Gait:    Normal native gait  Motor Observation:   no involuntary movements noted. Tone:    Appears normal  Posture:    Posture is normal. normal erect    Strength:    Strength is anti-gravity and symmetric in the upper and lower limbs.      Sensation: intact to LT       Assessment/Plan:  VINIA JEMMOTT is a 46 y.o. female here as requested by 57 Family M* for migraines. Getting botox and doing very well here for years. At baseline was having >15 headache days a month and > 8-10 moderate to severe migraine days a month and milder migraines as well. We initiated Botox on top of her other medications and it went down to >70% improvement in freq and severity of migraines and headaches. She also stopped alcohol. She has been on Topamax for years and we decreased it by 1/2 on 11/28. She take nurtec and relpax and it helps. She has 4 migraine days a month and < 10 total headache days a month failed imitrex, maxalt, relpax.  Stay on Topiramate 50mg (was on 100mg ) At onset of migraine: Relpax, zofran and nurtec. Repeat Relpax and zofran 2 hours later At next botox we can talk some more about how you are doing  Meds  ordered this encounter  Medications   Rimegepant Sulfate (NURTEC) 75 MG TBDP    Sig: Take 75 mg by mouth daily as  needed. For migraines. Take as close to onset of migraine as possible. One daily maximum.    Dispense:  16 tablet    Refill:  11    Has 4 migraine days a month and < 10 total headache days a month failed imitrex, maxalt, relpax.    Cc: [facility]  [facility]  Naomie DeanAntonia Darick Fetters, MD  [facility] [address] [place], [place] [id number]  Phone [phone] Fax [phone]

## 2022-10-15 ENCOUNTER — Telehealth: Payer: Self-pay | Admitting: Neurology

## 2022-10-15 NOTE — Telephone Encounter (Signed)
Chronic Migraine CPT 64615  Botox J0585 Units:200  G43.709 Chronic Migraine without aura, not intractable, without status migrainous   

## 2022-10-15 NOTE — Telephone Encounter (Signed)
Pt is on the schedule for botox injection for 11/18/22 and will need a new PA before appointment. Pt will need new PA with BCBS.

## 2022-10-15 NOTE — Telephone Encounter (Signed)
Please call patient and see if there has been any insurance change for 2024. If so, can they send a picture of front and back of card through mychart? If they cannot, please gather pt's insurance name, ID, Group #, and if there are Prescription benefits then we need the Rx ID, BIN, PCN, group numbers, and the provider pre-certification number from the back of the card. Thank you!  

## 2022-10-15 NOTE — Telephone Encounter (Signed)
Called pt. She said nothing has changed with her insurance.

## 2022-11-03 NOTE — Telephone Encounter (Signed)
Checking up on the PA for botox, pt is scheduled for 11/18/22

## 2022-11-06 NOTE — Telephone Encounter (Signed)
Any updates on PA?

## 2022-11-07 ENCOUNTER — Other Ambulatory Visit (HOSPITAL_COMMUNITY): Payer: Self-pay

## 2022-11-10 ENCOUNTER — Other Ambulatory Visit (HOSPITAL_COMMUNITY): Payer: Self-pay

## 2022-11-10 NOTE — Telephone Encounter (Signed)
Checking up on PA, pt scheduled for 11/18/22

## 2022-11-11 ENCOUNTER — Other Ambulatory Visit (HOSPITAL_COMMUNITY): Payer: Self-pay

## 2022-11-11 NOTE — Telephone Encounter (Signed)
   I filed a PA to the [employer] and as shown above pt already has a PA on file with them until 02/11/2023. I also called [employer] to verify if PA is required and [employer] doesn't require a PA for the [facility] and diagnosis codes. [employer] states that this will be Buy and [employer], if for some reason the office can not get the medication in then Tricare states to call [facility] to obtain the medication.   No reference number-but spoke with Jose V. On 11/11/2022.

## 2022-11-18 ENCOUNTER — Ambulatory Visit (INDEPENDENT_AMBULATORY_CARE_PROVIDER_SITE_OTHER): Payer: BC Managed Care – PPO | Admitting: Neurology

## 2022-11-18 DIAGNOSIS — G43709 Chronic migraine without aura, not intractable, without status migrainosus: Secondary | ICD-10-CM | POA: Diagnosis not present

## 2022-11-18 MED ORDER — ONABOTULINUMTOXINA 200 UNITS IJ SOLR
155.0000 [IU] | Freq: Once | INTRAMUSCULAR | Status: AC
  Administered 2022-11-18: 155 [IU] via INTRAMUSCULAR

## 2022-11-18 NOTE — Progress Notes (Signed)
Botox- 200 units x 1 vial Lot: [id number] Expiration: 02/2025 NDC: [id number]  Bacteriostatic 0.9% Sodium Chloride- 24m total Lot: [id number]: 07/2024 NDC: [id number] Dx: [id number]

## 2022-11-18 NOTE — Progress Notes (Signed)
11/18/2022: [facility] is a 47 y.o. female here as requested by [facility]* for migraines. Getting botox and doing very well here for years. At baseline was having >15 headache days a month and > 8-10 moderate to severe migraine days a month and milder migraines as well. We initiated Botox on top of her other medications and it went down to >70% improvement in freq and severity of migraines and headaches. She also stopped alcohol. She has been on Topamax for years and we decreased it by 1/2 on 11/28. She take nurtec and relpax and it helps. She has 4 migraine days a month and < 10 total headache days a month failed imitrex, maxalt, relpax.On topamax. Started Nurtec last month.   08/26/2022: stable doing great cut topamax in 1/2 try nurtec - gave sample  05/26/2022: stable:   02/17/2022: stable, >>90% improvement in migraine freq and severity  11/19/2021: Stable  08/09/2021 She continues Botox, topiramate and Relpax. She is doing well. Not having headaches. She rarely has migraines until the week prior to Botox.she sees lisa poulos, was the one who connected [place]. She has a son in [place] now working for the stadium and is going to see Lovena Le swift, she has another graduating next [employer]. Do the botox high in the forehead she has a heavy brow.  Consent Form Botulism Toxin Injection For Chronic Migraine    Reviewed orally with patient, additionally signature is on file:  Botulism toxin has been approved by the Federal drug administration for treatment of chronic migraine. Botulism toxin does not cure chronic migraine and it may not be effective in some patients.  The administration of botulism toxin is accomplished by injecting a small amount of toxin into the muscles of the neck and head. Dosage must be titrated for each individual. Any benefits resulting from botulism toxin tend to wear off after 3 months with a repeat injection required if benefit is to be maintained. Injections  are usually done every 3-4 months with maximum effect peak achieved by about 2 or 3 weeks. Botulism toxin is expensive and you should be sure of what costs you will incur resulting from the injection.  The side effects of botulism toxin use for chronic migraine may include:   -Transient, and usually mild, facial weakness with facial injections  -Transient, and usually mild, head or neck weakness with head/neck injections  -Reduction or loss of forehead facial animation due to forehead muscle weakness  -Eyelid drooping  -Dry eye  -Pain at the site of injection or bruising at the site of injection  -Double vision  -Potential unknown long term risks   Contraindications: You should not have Botox if you are pregnant, nursing, allergic to albumin, have an infection, skin condition, or muscle weakness at the site of the injection, or have myasthenia gravis, Lambert-Eaton syndrome, or ALS.  It is also possible that as with any injection, there may be an allergic reaction or no effect from the medication. Reduced effectiveness after repeated injections is sometimes seen and rarely infection at the injection site may occur. All care will be taken to prevent these side effects. If therapy is given over a long time, atrophy and wasting in the muscle injected may occur. Occasionally the patient's become refractory to treatment because they develop antibodies to the toxin. In this event, therapy needs to be modified.  I have read the above information and consent to the administration of botulism toxin.    BOTOX PROCEDURE NOTE  FOR MIGRAINE HEADACHE  Contraindications and precautions discussed with patient(above). Aseptic procedure was observed and patient tolerated procedure. Procedure performed by Debbora Presto, FNP-C.   The condition has existed for more than 6 months, and pt does not have a diagnosis of ALS, Myasthenia Gravis or Lambert-Eaton Syndrome.  Risks and benefits of injections discussed and pt  agrees to proceed with the procedure.  Written consent obtained  These injections are medically necessary. Pt  receives good benefits from these injections. These injections do not cause sedations or hallucinations which the oral therapies may cause.   Description of procedure:  The patient was placed in a sitting position. The standard protocol was used for Botox as follows, with 5 units of Botox injected at each site:  -Procerus muscle, midline injection  -Corrugator muscle, bilateral injection  -Frontalis muscle, bilateral injection, with 2 sites each side, medial injection was performed in the upper one third of the frontalis muscle, in the region vertical from the medial inferior edge of the superior orbital rim. The lateral injection was again in the upper one third of the forehead vertically above the lateral limbus of the cornea, 1.5 cm lateral to the medial injection site.  -Temporalis muscle injection, 4 sites, bilaterally. The first injection was 3 cm above the tragus of the ear, second injection site was 1.5 cm to 3 cm up from the first injection site in line with the tragus of the ear. The third injection site was 1.5-3 cm forward between the first 2 injection sites. The fourth injection site was 1.5 cm posterior to the second injection site. 5th site laterally in the temporalis  muscleat the level of the outer canthus.  -Occipitalis muscle injection, 3 sites, bilaterally. The first injection was done one half way between the occipital protuberance and the tip of the mastoid process behind the ear. The second injection site was done lateral and superior to the first, 1 fingerbreadth from the first injection. The third injection site was 1 fingerbreadth superiorly and medially from the first injection site.  -Cervical paraspinal muscle injection, 2 sites, bilaterally. The first injection site was 1 cm from the midline of the cervical spine, 3 cm inferior to the lower border of the  occipital protuberance. The second injection site was 1.5 cm superiorly and laterally to the first injection site.  -Trapezius muscle injection was performed at 3 sites, bilaterally. The first injection site was in the upper trapezius muscle halfway between the inflection point of the neck, and the acromion. The second injection site was one half way between the acromion and the first injection site. The third injection was done between the first injection site and the inflection point of the neck.     Will return for repeat injection in 3 months.   A total of 155 units of Botox was used, 45U Botox not injected was wasted. The patient tolerated the procedure well, there were no complications of the above procedure.

## 2022-12-23 ENCOUNTER — Other Ambulatory Visit (HOSPITAL_COMMUNITY): Payer: Self-pay

## 2022-12-23 NOTE — Telephone Encounter (Signed)
   Submitted a PA via [employer] for [employer] requested back date approval for service on 11/18/2022 in case [employer] did not cover at that time.    I also submitted a PA Botox form along with clinicals and a letter requesting to have a current PA via Medical benefits on file for future visits and asked if they would be able to back bill for the date of 11/18/2022-will update once I receive information from the insurance. Faxed to [phone].

## 2022-12-23 NOTE — Telephone Encounter (Signed)
Benefit Verification BV-WOMWEAK Submitted!

## 2022-12-26 ENCOUNTER — Telehealth: Payer: Self-pay

## 2022-12-26 NOTE — Telephone Encounter (Signed)
I was asked by [place] to follow up on the PA for visit 11/18/2022-This was billed under Medical benefits-at that time we have approval for medical benefits under her [employer] but not [employer] but we did have a pharmacy benefit approval under [place]. I called [employer]BS to try to see if we could correct the issue and get PA approval back dated for the date of 11/18/2022-however BCBS states they do not participate in retro/back dating PA's. I will ensure PT has medical approval for future visits for [employer]BS and [place].

## 2022-12-26 NOTE — Telephone Encounter (Addendum)
Pharmacy Patient Advocate Encounter- Botox BIV-Medical Benefit:  J code: [id number] CPT code: [id number] Dx Code: G43.709  PA was submitted to [facility] and has been approved through: 11/24/2023 Authorization# [id number]  Buy and [employer]

## 2022-12-26 NOTE — Telephone Encounter (Signed)
Additional Information for BV-WOMWEAK Submitted!

## 2023-01-07 ENCOUNTER — Other Ambulatory Visit: Payer: Self-pay | Admitting: General Surgery

## 2023-01-07 DIAGNOSIS — Z9884 Bariatric surgery status: Secondary | ICD-10-CM

## 2023-01-07 DIAGNOSIS — K219 Gastro-esophageal reflux disease without esophagitis: Secondary | ICD-10-CM

## 2023-01-09 ENCOUNTER — Ambulatory Visit
Admission: RE | Admit: 2023-01-09 | Discharge: 2023-01-09 | Disposition: A | Discharge status: Home / Self Care | Payer: BC Managed Care – PPO | Source: Ambulatory Visit | Attending: General Surgery | Admitting: General Surgery

## 2023-01-09 DIAGNOSIS — Z9884 Bariatric surgery status: Secondary | ICD-10-CM

## 2023-01-09 DIAGNOSIS — K219 Gastro-esophageal reflux disease without esophagitis: Secondary | ICD-10-CM

## 2023-01-19 ENCOUNTER — Telehealth: Payer: Self-pay | Admitting: Neurology

## 2023-01-19 NOTE — Telephone Encounter (Signed)
New auth needed before 5/22 Botox appt, current auth expired 5/15- BCBS and T[employer].

## 2023-01-19 NOTE — Telephone Encounter (Signed)
Correction: 5/20 appt.

## 2023-01-26 ENCOUNTER — Encounter: Payer: BC Managed Care – PPO | Attending: General Surgery | Admitting: Dietician

## 2023-01-26 ENCOUNTER — Encounter: Payer: Self-pay | Admitting: Dietician

## 2023-01-26 VITALS — Ht 64.0 in | Wt 197.8 lb

## 2023-01-26 DIAGNOSIS — E669 Obesity, unspecified: Secondary | ICD-10-CM | POA: Insufficient documentation

## 2023-01-26 NOTE — Progress Notes (Signed)
Bariatric Nutrition Follow-Up Visit Medical Nutrition Therapy  Appt Start Time: 4:15   End Time: 5:05  Surgery type: Gastric Band Surgery Date: 2009  NUTRITION ASSESSMENT   Anthropometrics  Start weight at [facility]: 240 lbs (date: 2009) Height: 64 in Today's weight: 197.8 lbs  Body Composition Scale Date  Weight  lbs 197.8  Total Body Fat  % 39.2     Visceral Fat 11  Fat-Free Mass  % 60.7     Total Body Water  % 44.8     Muscle-Mass  lbs 31.0  BMI 33.8  Body Fat Displacement ---        Torso  lbs 48.0        Left Leg  lbs 9.6        Right Leg  lbs 9.6        Left Arm  lbs 4.8        Right Arm  lbs 4.8   Clinical  Medical hx: obesity, gastric band Medications: Topamax, Relpax nurtec, pristiq, Lamictal  Labs: Chol 203; LDL 118; glucose 103   Lifestyle & Dietary Hx  Pt states she lost about 20-25 pounds with her son, stating he was on a program that he was on. Pt states she needs to get back on track and get back to the basics. Pt states it is only her, so she doesn't cook much. Pt states she walks daily about 2 miles, spending 40 minutes a day, stating it is therapeutic for her to walk in the afternoon.  Estimated daily fluid intake: 48-64 oz Estimated daily protein intake: 60+ g Supplements: joint supplement, B vitamin complex Current average weekly physical activity: walk 2 miles every day, stating it is about 40 minutes a day.   24-Hr Dietary Recall First Meal: protein shake and coffee Snack:   Second Meal: yogurt or cottage cheese and a piece of fruit and trail mix Snack: trail mix, protein shake  Third Meal: sandwich or trader joe's meals or ground sirloin meat with avocado Snack: atkins protein bar, milk or yogurt covered pretzels. Beverages: water, coffee, milk  NUTRITION DIAGNOSIS  Overweight/obesity ([place]-3.3) related to past poor dietary habits and physical inactivity as evidenced by completed bariatric surgery and following dietary guidelines for continued  weight loss and healthy nutrition status.   NUTRITION INTERVENTION Nutrition counseling (C-1) and education (E-2) to facilitate bariatric surgery goals, including: The importance of consuming adequate calories as well as certain nutrients daily due to the body's need for essential vitamins, minerals, and fats The importance of daily physical activity and to reach a goal of at least 150 minutes of moderate to vigorous physical activity weekly (or as directed by their physician) due to benefits such as increased musculature and improved lab values The importance of intuitive eating specifically learning hunger-satiety cues and understanding the importance of learning a new body: The importance of mindful eating to avoid grazing behaviors  Reviewed all bariatric strategies; encouraged patient to get back to the basics. Encouraged patient to honor their body's internal hunger and fullness cues.  Throughout the day, check in mentally and rate hunger. Stop eating when satisfied not full regardless of how much food is left on the plate.  Get more if still hungry 20-30 minutes later.  The key is to honor satisfaction so throughout the meal, rate fullness factor and stop when comfortably satisfied not physically full. The key is to honor hunger and fullness without any feelings of guilt or shame.  Pay attention to what  the internal cues are, rather than any external factors. This will enhance the confidence you have in listening to your own body and following those internal cues enabling you to increase how often you eat when you are hungry not out of appetite and stop when you are satisfied not full.  Encouraged pt to continue to eat balanced meals inclusive of non starchy vegetables 2 times a day 7 days a week Encouraged pt to choose lean protein sources: limiting beef, pork, sausage, hotdogs, and lunch meat Encourage pt to choose healthy fats such as plant based limiting animal fats Encouraged pt to continue  to drink a minium 64 fluid ounces with half being plain water to satisfy proper hydration    Goals Reset; get to the grocery store and start fixing meals again; change the foods she keeps in the house Eat throughout the day; Use Meal Ideas handout; eat at set meal times; eat protein first, then non-starchy vegetables, then complex carbohydrates. Take 20-30 minutes to eat.  Handouts Provided Include  Maintenance Plan Meal Ideas handout  Learning Style & Readiness for Change Teaching method utilized: Visual & Auditory  Demonstrated degree of understanding via: Teach Back  Readiness Level: preparation Barriers to learning/adherence to lifestyle change: nothing identified  RD's Notes for Next Visit Assess adherence to pt chosen goals  MONITORING & EVALUATION Dietary intake, weekly physical activity, body weight.  Next Steps Patient is to follow-up in 2 months for follow-up.

## 2023-02-04 NOTE — Telephone Encounter (Signed)
Checking on auth, pt scheduled 5/20.

## 2023-02-05 NOTE — Telephone Encounter (Signed)
Please see telephone note dated-10/15/2022. There are approvals for both insurances via [place].

## 2023-02-16 ENCOUNTER — Ambulatory Visit (INDEPENDENT_AMBULATORY_CARE_PROVIDER_SITE_OTHER): Payer: BC Managed Care – PPO | Admitting: Neurology

## 2023-02-16 ENCOUNTER — Encounter: Payer: Self-pay | Admitting: Neurology

## 2023-02-16 DIAGNOSIS — G43709 Chronic migraine without aura, not intractable, without status migrainosus: Secondary | ICD-10-CM | POA: Diagnosis not present

## 2023-02-16 MED ORDER — ONABOTULINUMTOXINA 200 UNITS IJ SOLR
155.0000 [IU] | Freq: Once | INTRAMUSCULAR | Status: AC
Start: 2023-02-16 — End: 2023-02-16
  Administered 2023-02-16: 155 [IU] via INTRAMUSCULAR

## 2023-02-16 NOTE — Progress Notes (Signed)
Botox- 200 units x 1 vial Lot: [id number] Expiration: 02/2025 NDC: 4540-9811-91  Bacteriostatic 0.9% Sodium Chloride- * mL  Lot: [id number] Expiration: 12/29/2023 NDC: 6213-0865-78  Dx: I69.629  B/B Witnessed by Alveria Apley. CMA

## 2023-02-16 NOTE — Progress Notes (Signed)
02/16/2023: stable :Recommend trying to get [facility]. She is BMI > 30, she has failed a weight loss "proram" for 6 months and not los 5% weight.  11/18/2022: Kimberly Simpson is a 47 y.o. female here as requested by [facility] for migraines. Getting botox and doing very well here for years. At baseline was having >15 headache days a month and > 8-10 moderate to severe migraine days a month and milder migraines as well. We initiated Botox on top of her other medications and it went down to >70% improvement in freq and severity of migraines and headaches. She also stopped alcohol. She has been on Topamax for years and we decreased it by 1/2 on 11/28. She take nurtec and relpax and it helps. She has 4 migraine days a month and < 10 total headache days a month failed imitrex, maxalt, relpax.On topamax. Started Nurtec last month.   08/26/2022: stable doing great cut topamax in 1/2 try nurtec - gave sample  05/26/2022: stable:   02/17/2022: stable, >>90% improvement in migraine freq and severity  11/19/2021: Stable  08/09/2021 She continues Botox, topiramate and Relpax. She is doing well. Not having headaches. She rarely has migraines until the week prior to Botox.she sees lisa poulos, was the one who connected [place]. She has a son in [place] now working for the stadium and is going to see [name], she has another graduating next [employer]. Do the botox high in the forehead she has a heavy brow.  Consent Form Botulism Toxin Injection For Chronic Migraine    Reviewed orally with patient, additionally signature is on file:  Botulism toxin has been approved by the Federal drug administration for treatment of chronic migraine. Botulism toxin does not cure chronic migraine and it may not be effective in some patients.  The administration of botulism toxin is accomplished by injecting a small amount of toxin into the muscles of the neck and head. Dosage must be titrated for each individual. Any  benefits resulting from botulism toxin tend to wear off after 3 months with a repeat injection required if benefit is to be maintained. Injections are usually done every 3-4 months with maximum effect peak achieved by about 2 or 3 weeks. Botulism toxin is expensive and you should be sure of what costs you will incur resulting from the injection.  The side effects of botulism toxin use for chronic migraine may include:   -Transient, and usually mild, facial weakness with facial injections  -Transient, and usually mild, head or neck weakness with head/neck injections  -Reduction or loss of forehead facial animation due to forehead muscle weakness  -Eyelid drooping  -Dry eye  -Pain at the site of injection or bruising at the site of injection  -Double vision  -Potential unknown long term risks   Contraindications: You should not have Botox if you are pregnant, nursing, allergic to albumin, have an infection, skin condition, or muscle weakness at the site of the injection, or have myasthenia gravis, Lambert-Eaton syndrome, or ALS.  It is also possible that as with any injection, there may be an allergic reaction or no effect from the medication. Reduced effectiveness after repeated injections is sometimes seen and rarely infection at the injection site may occur. All care will be taken to prevent these side effects. If therapy is given over a long time, atrophy and wasting in the muscle injected may occur. Occasionally the patient's become refractory to treatment because they develop antibodies to the toxin. In this  event, therapy needs to be modified.  I have read the above information and consent to the administration of botulism toxin.    BOTOX PROCEDURE NOTE FOR MIGRAINE HEADACHE  Contraindications and precautions discussed with patient(above). Aseptic procedure was observed and patient tolerated procedure. Procedure performed by Shawnie Dapper, FNP-C.   The condition has existed for more than 6  months, and pt does not have a diagnosis of ALS, Myasthenia Gravis or Lambert-Eaton Syndrome.  Risks and benefits of injections discussed and pt agrees to proceed with the procedure.  Written consent obtained  These injections are medically necessary. Pt  receives good benefits from these injections. These injections do not cause sedations or hallucinations which the oral therapies may cause.   Description of procedure:  The patient was placed in a sitting position. The standard protocol was used for Botox as follows, with 5 units of Botox injected at each site:  -Procerus muscle, midline injection  -Corrugator muscle, bilateral injection  -Frontalis muscle, bilateral injection, with 2 sites each side, medial injection was performed in the upper one third of the frontalis muscle, in the region vertical from the medial inferior edge of the superior orbital rim. The lateral injection was again in the upper one third of the forehead vertically above the lateral limbus of the cornea, 1.5 cm lateral to the medial injection site.  -Temporalis muscle injection, 4 sites, bilaterally. The first injection was 3 cm above the tragus of the ear, second injection site was 1.5 cm to 3 cm up from the first injection site in line with the tragus of the ear. The third injection site was 1.5-3 cm forward between the first 2 injection sites. The fourth injection site was 1.5 cm posterior to the second injection site. 5th site laterally in the temporalis  muscleat the level of the outer canthus.  -Occipitalis muscle injection, 3 sites, bilaterally. The first injection was done one half way between the occipital protuberance and the tip of the mastoid process behind the ear. The second injection site was done lateral and superior to the first, 1 fingerbreadth from the first injection. The third injection site was 1 fingerbreadth superiorly and medially from the first injection site.  -Cervical paraspinal muscle  injection, 2 sites, bilaterally. The first injection site was 1 cm from the midline of the cervical spine, 3 cm inferior to the lower border of the occipital protuberance. The second injection site was 1.5 cm superiorly and laterally to the first injection site.  -Trapezius muscle injection was performed at 3 sites, bilaterally. The first injection site was in the upper trapezius muscle halfway between the inflection point of the neck, and the acromion. The second injection site was one half way between the acromion and the first injection site. The third injection was done between the first injection site and the inflection point of the neck.     Will return for repeat injection in 3 months.   A total of 155 units of Botox was used, 45U Botox not injected was wasted. The patient tolerated the procedure well, there were no complications of the above procedure.

## 2023-03-23 ENCOUNTER — Encounter: Payer: BC Managed Care – PPO | Attending: General Surgery | Admitting: Dietician

## 2023-03-23 ENCOUNTER — Encounter: Payer: Self-pay | Admitting: Dietician

## 2023-03-23 VITALS — Ht 64.0 in | Wt 191.5 lb

## 2023-03-23 DIAGNOSIS — E669 Obesity, unspecified: Secondary | ICD-10-CM | POA: Insufficient documentation

## 2023-03-23 NOTE — Progress Notes (Signed)
Bariatric Nutrition Follow-Up Visit Medical Nutrition Therapy  Appt Start Time: 9:45   End Time: 10:16  Surgery type: Gastric Band Surgery Date: 2009  NUTRITION ASSESSMENT   Anthropometrics  Start weight at [facility]: 240 lbs (date: 2009) Height: 64 in Today's weight: 191.5 lbs  Body Composition Scale 01/26/2023 03/23/2023  Weight  lbs 197.8 191.5  Total Body Fat  % 39.2 38.2     Visceral Fat 11 11  Fat-Free Mass  % 60.7 61.7     Total Body Water  % 44.8 45.3     Muscle-Mass  lbs 31.0 30.9  BMI 33.8 32.7  Body Fat Displacement ---         Torso  lbs 48.0 45.3        Left Leg  lbs 9.6 9.0        Right Leg  lbs 9.6 9.0        Left Arm  lbs 4.8 4.8        Right Arm  lbs 4.8 4.8   Clinical  Medical hx: obesity, gastric band Medications: Topamax, Relpax nurtec, pristiq, Lamictal  Labs: Chol 203; LDL 118; glucose 103    Lifestyle & Dietary Hx  Pt states they did a fill on her lap band, stating that went well. Pt states she sees him again in July. Pt states she is eating more food, stating she didn't think she was getting enough. 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  Estimated daily fluid intake: 48-64 oz Estimated daily protein intake: 60+ g Supplements: joint supplement, B vitamin complex, multivitamin Current average weekly physical activity: walk 2 miles every day, stating it is about 30-40 minutes a day.   24-Hr Dietary Recall First Meal: coffee, greek yogurt Snack:   Second Meal: cold cuts and cheese Snack: trail mix, protein shake  Third Meal: sandwich or trader joe's meals or ground sirloin meat with avocado Snack: Beverages: water, coffee, milk  NUTRITION DIAGNOSIS  Overweight/obesity ([place]-3.3) related to past poor dietary habits and physical inactivity as evidenced by completed bariatric surgery and following dietary guidelines for continued weight loss and healthy nutrition status.    NUTRITION INTERVENTION Nutrition counseling (C-1) and education (E-2) to facilitate bariatric surgery goals, including: The importance of consuming adequate calories as well as certain nutrients daily due to the body's need for essential vitamins, minerals, and fats The importance of daily physical activity and to reach a goal of at least 150 minutes of moderate to vigorous physical activity weekly (or as directed by their physician) due to benefits such as increased musculature and improved lab values The importance of intuitive eating specifically learning hunger-satiety cues and understanding the importance of learning a new body: The importance of mindful eating to avoid grazing behaviors  Reviewed all bariatric strategies; encouraged patient to get back to the basics. Encouraged patient to honor their body's internal hunger and fullness cues.  Throughout the day, check in mentally and rate hunger. Stop eating when satisfied not full regardless of how much food is left on the plate.  Get more if still hungry 20-30 minutes later.  The key is to honor satisfaction so throughout the meal, rate fullness factor and stop when comfortably satisfied not physically full. The key is to honor hunger and fullness without any feelings of guilt or shame.  Pay attention to what the internal cues are, rather than any external factors. This will  enhance the confidence you have in listening to your own body and following those internal cues enabling you to increase how often you eat when you are hungry not out of appetite and stop when you are satisfied not full.  Encouraged pt to continue to eat balanced meals inclusive of non starchy vegetables 2 times a day 7 days a week Encouraged pt to choose lean protein sources: limiting beef, pork, sausage, hotdogs, and lunch meat Encourage pt to choose healthy fats such as plant based limiting animal fats Encouraged pt to continue to drink a minium 64 fluid ounces with half  being plain water to satisfy proper hydration    Goals Continue: Reset; get to the grocery store and start fixing meals again; change the foods she keeps in the house Continue: Eat throughout the day; Use Meal Ideas handout; eat at set meal times; eat protein first, then non-starchy vegetables, then complex carbohydrates. Continue: Take 20-30 minutes to eat.  Handouts Provided Include  Bariatric MyPlate  Learning Style & Readiness for Change Teaching method utilized: Visual & Auditory  Demonstrated degree of understanding via: Teach Back  Readiness Level: preparation Barriers to learning/adherence to lifestyle change: nothing identified  RD's Notes for Next Visit Assess adherence to pt chosen goals  MONITORING & EVALUATION Dietary intake, weekly physical activity, body weight.  Next Steps Patient will call when ready to follow-up.

## 2023-05-13 ENCOUNTER — Ambulatory Visit (INDEPENDENT_AMBULATORY_CARE_PROVIDER_SITE_OTHER): Payer: BC Managed Care – PPO | Admitting: Neurology

## 2023-05-13 DIAGNOSIS — G43709 Chronic migraine without aura, not intractable, without status migrainosus: Secondary | ICD-10-CM | POA: Diagnosis not present

## 2023-05-13 DIAGNOSIS — G43009 Migraine without aura, not intractable, without status migrainosus: Secondary | ICD-10-CM

## 2023-05-13 MED ORDER — ONABOTULINUMTOXINA 200 UNITS IJ SOLR
155.0000 [IU] | Freq: Once | INTRAMUSCULAR | Status: AC
Start: 2023-05-13 — End: 2023-05-13
  Administered 2023-05-13: 155 [IU] via INTRAMUSCULAR

## 2023-05-13 NOTE — Progress Notes (Unsigned)
05/13/2023: prescribe zavzpret*** 02/16/2023: stable :Recommend trying to get [facility]. She is BMI > 30, she has failed a weight loss "proram" for 6 months and not los 5% weight.  11/18/2022: Kimberly Simpson is a 47 y.o. female here as requested by [facility] for migraines. Getting botox and doing very well here for years. At baseline was having >15 headache days a month and > 8-10 moderate to severe migraine days a month and milder migraines as well. We initiated Botox on top of her other medications and it went down to >70% improvement in freq and severity of migraines and headaches. She also stopped alcohol. She has been on Topamax for years and we decreased it by 1/2 on 11/28. She take nurtec and relpax and it helps. She has 4 migraine days a month and < 10 total headache days a month failed imitrex, maxalt, relpax.On topamax. Started Nurtec last month.   08/26/2022: stable doing great cut topamax in 1/2 try nurtec - gave sample  05/26/2022: stable:   02/17/2022: stable, >>90% improvement in migraine freq and severity  11/19/2021: Stable  08/09/2021 She continues Botox, topiramate and Relpax. She is doing well. Not having headaches. She rarely has migraines until the week prior to Botox.she sees lisa poulos, was the one who connected [place]. She has a son in [place] now working for the stadium and is going to see [name], she has another graduating next [employer]. Do the botox high in the forehead she has a heavy brow.  Consent Form Botulism Toxin Injection For Chronic Migraine    Reviewed orally with patient, additionally signature is on file:  Botulism toxin has been approved by the Federal drug administration for treatment of chronic migraine. Botulism toxin does not cure chronic migraine and it may not be effective in some patients.  The administration of botulism toxin is accomplished by injecting a small amount of toxin into the muscles of the neck and head. Dosage must be  titrated for each individual. Any benefits resulting from botulism toxin tend to wear off after 3 months with a repeat injection required if benefit is to be maintained. Injections are usually done every 3-4 months with maximum effect peak achieved by about 2 or 3 weeks. Botulism toxin is expensive and you should be sure of what costs you will incur resulting from the injection.  The side effects of botulism toxin use for chronic migraine may include:   -Transient, and usually mild, facial weakness with facial injections  -Transient, and usually mild, head or neck weakness with head/neck injections  -Reduction or loss of forehead facial animation due to forehead muscle weakness  -Eyelid drooping  -Dry eye  -Pain at the site of injection or bruising at the site of injection  -Double vision  -Potential unknown long term risks   Contraindications: You should not have Botox if you are pregnant, nursing, allergic to albumin, have an infection, skin condition, or muscle weakness at the site of the injection, or have myasthenia gravis, Lambert-Eaton syndrome, or ALS.  It is also possible that as with any injection, there may be an allergic reaction or no effect from the medication. Reduced effectiveness after repeated injections is sometimes seen and rarely infection at the injection site may occur. All care will be taken to prevent these side effects. If therapy is given over a long time, atrophy and wasting in the muscle injected may occur. Occasionally the patient's become refractory to treatment because they develop antibodies to  the toxin. In this event, therapy needs to be modified.  I have read the above information and consent to the administration of botulism toxin.    BOTOX PROCEDURE NOTE FOR MIGRAINE HEADACHE  Contraindications and precautions discussed with patient(above). Aseptic procedure was observed and patient tolerated procedure. Procedure performed by Shawnie Dapper, FNP-C.   The  condition has existed for more than 6 months, and pt does not have a diagnosis of ALS, Myasthenia Gravis or Lambert-Eaton Syndrome.  Risks and benefits of injections discussed and pt agrees to proceed with the procedure.  Written consent obtained  These injections are medically necessary. Pt  receives good benefits from these injections. These injections do not cause sedations or hallucinations which the oral therapies may cause.   Description of procedure:  The patient was placed in a sitting position. The standard protocol was used for Botox as follows, with 5 units of Botox injected at each site:  -Procerus muscle, midline injection  -Corrugator muscle, bilateral injection  -Frontalis muscle, bilateral injection, with 2 sites each side, medial injection was performed in the upper one third of the frontalis muscle, in the region vertical from the medial inferior edge of the superior orbital rim. The lateral injection was again in the upper one third of the forehead vertically above the lateral limbus of the cornea, 1.5 cm lateral to the medial injection site.  -Temporalis muscle injection, 4 sites, bilaterally. The first injection was 3 cm above the tragus of the ear, second injection site was 1.5 cm to 3 cm up from the first injection site in line with the tragus of the ear. The third injection site was 1.5-3 cm forward between the first 2 injection sites. The fourth injection site was 1.5 cm posterior to the second injection site. 5th site laterally in the temporalis  muscleat the level of the outer canthus.  -Occipitalis muscle injection, 3 sites, bilaterally. The first injection was done one half way between the occipital protuberance and the tip of the mastoid process behind the ear. The second injection site was done lateral and superior to the first, 1 fingerbreadth from the first injection. The third injection site was 1 fingerbreadth superiorly and medially from the first injection  site.  -Cervical paraspinal muscle injection, 2 sites, bilaterally. The first injection site was 1 cm from the midline of the cervical spine, 3 cm inferior to the lower border of the occipital protuberance. The second injection site was 1.5 cm superiorly and laterally to the first injection site.  -Trapezius muscle injection was performed at 3 sites, bilaterally. The first injection site was in the upper trapezius muscle halfway between the inflection point of the neck, and the acromion. The second injection site was one half way between the acromion and the first injection site. The third injection was done between the first injection site and the inflection point of the neck.     Will return for repeat injection in 3 months.   A total of 155 units of Botox was used, 45U Botox not injected was wasted. The patient tolerated the procedure well, there were no complications of the above procedure.

## 2023-05-13 NOTE — Progress Notes (Unsigned)
Botox- 200 units x 1 vial Lot: [id number] Expiration: 07/2025 NDC: 1610-9604-54  Bacteriostatic 0.9% Sodium Chloride- 4 mL  Lot: [id number] Expiration: 12/29/2022 NDC: 1478-2956-21  Dx: H08.657  B/B Witnessed by Maryjean Ka

## 2023-05-14 ENCOUNTER — Other Ambulatory Visit: Payer: Self-pay | Admitting: Neurology

## 2023-05-14 DIAGNOSIS — G43009 Migraine without aura, not intractable, without status migrainosus: Secondary | ICD-10-CM

## 2023-05-14 MED ORDER — ZAVZPRET 10 MG/ACT NA SOLN
10.0000 mg | Freq: Every day | NASAL | 11 refills | Status: DC | PRN
Start: 2023-05-14 — End: 2023-08-11

## 2023-05-21 ENCOUNTER — Telehealth: Payer: Self-pay | Admitting: *Deleted

## 2023-05-21 NOTE — Telephone Encounter (Addendum)
Message sent to pt about this.Kimberly Simpson

## 2023-05-21 NOTE — Telephone Encounter (Signed)
Received message from pharmacy that Kimberly Simpson is not covered. Alternatives are Nurtec and imitrex nasal spray. Message sent to pt via mychart.

## 2023-05-26 ENCOUNTER — Other Ambulatory Visit: Payer: Self-pay | Admitting: Neurology

## 2023-05-26 MED ORDER — TOSYMRA 10 MG/ACT NA SOLN: 1.0000 | NASAL | 6 refills | Status: DC | PRN

## 2023-05-26 NOTE — Telephone Encounter (Signed)
I will send in Tosymra and see if that works

## 2023-07-20 ENCOUNTER — Other Ambulatory Visit: Payer: Self-pay | Admitting: *Deleted

## 2023-07-20 DIAGNOSIS — G43009 Migraine without aura, not intractable, without status migrainosus: Secondary | ICD-10-CM

## 2023-07-20 MED ORDER — NURTEC 75 MG PO TBDP
75.0000 mg | ORAL_TABLET | Freq: Every day | ORAL | 4 refills | Status: DC | PRN
Start: 2023-07-20 — End: 2024-07-19

## 2023-08-11 ENCOUNTER — Ambulatory Visit (INDEPENDENT_AMBULATORY_CARE_PROVIDER_SITE_OTHER): Payer: BC Managed Care – PPO | Admitting: Neurology

## 2023-08-11 DIAGNOSIS — G43709 Chronic migraine without aura, not intractable, without status migrainosus: Secondary | ICD-10-CM | POA: Diagnosis not present

## 2023-08-11 MED ORDER — ONABOTULINUMTOXINA 200 UNITS IJ SOLR
155.0000 [IU] | Freq: Once | INTRAMUSCULAR | Status: AC
Start: 2023-08-11 — End: 2023-08-11
  Administered 2023-08-11: 155 [IU] via INTRAMUSCULAR

## 2023-08-11 NOTE — Progress Notes (Signed)
Botox consent signed  Botox- 200 units x 1 vial Lot: [id number] Expiration: 11/2025 NDC: 5643-3295-18  Bacteriostatic 0.9% Sodium Chloride- 4 mL  Lot: [id number] Expiration: 12/29/2023 NDC: 6301-6010-93  Dx: A35.573 B/B Witnessed by Alveria Apley CMA

## 2023-08-11 NOTE — Progress Notes (Signed)
08/11/2023: patient feels her masseters worsen her migraines I place 5 units in each, place the botox high in the forehead.  Stable, doing well  05/13/2023: prescribe zavzpret for acute migraines in the morning. She is doing very well 4 migraines a month and < 10 total headache days a month, >50% improvement in freq and severity and duration.    02/16/2023: stable :Recommend trying to get [facility]. She is BMI > 30, she has failed a weight loss "proram" for 6 months and not los 5% weight.  11/18/2022: Kimberly Simpson is a 47 y.o. female here as requested by [facility] for migraines. Getting botox and doing very well here for years. At baseline was having >15 headache days a month and > 8-10 moderate to severe migraine days a month and milder migraines as well. We initiated Botox on top of her other medications and it went down to >70% improvement in freq and severity of migraines and headaches. She also stopped alcohol. She has been on Topamax for years and we decreased it by 1/2 on 11/28. She take nurtec and relpax and it helps. She has 4 migraine days a month and < 10 total headache days a month failed imitrex, maxalt, relpax.On topamax. Started Nurtec last month.   08/26/2022: stable doing great cut topamax in 1/2 try nurtec - gave sample  05/26/2022: stable:   02/17/2022: stable, >>90% improvement in migraine freq and severity  11/19/2021: Stable  08/09/2021 She continues Botox, topiramate and Relpax. She is doing well. Not having headaches. She rarely has migraines until the week prior to Botox.she sees lisa poulos, was the one who connected [place]. She has a son in [place] now working for the stadium and is going to see [name], she has another graduating next [employer]. Do the botox high in the forehead she has a heavy brow.  Consent Form Botulism Toxin Injection For Chronic Migraine    Reviewed orally with patient, additionally signature is on file:  Botulism toxin has been  approved by the Federal drug administration for treatment of chronic migraine. Botulism toxin does not cure chronic migraine and it may not be effective in some patients.  The administration of botulism toxin is accomplished by injecting a small amount of toxin into the muscles of the neck and head. Dosage must be titrated for each individual. Any benefits resulting from botulism toxin tend to wear off after 3 months with a repeat injection required if benefit is to be maintained. Injections are usually done every 3-4 months with maximum effect peak achieved by about 2 or 3 weeks. Botulism toxin is expensive and you should be sure of what costs you will incur resulting from the injection.  The side effects of botulism toxin use for chronic migraine may include:   -Transient, and usually mild, facial weakness with facial injections  -Transient, and usually mild, head or neck weakness with head/neck injections  -Reduction or loss of forehead facial animation due to forehead muscle weakness  -Eyelid drooping  -Dry eye  -Pain at the site of injection or bruising at the site of injection  -Double vision  -Potential unknown long term risks   Contraindications: You should not have Botox if you are pregnant, nursing, allergic to albumin, have an infection, skin condition, or muscle weakness at the site of the injection, or have myasthenia gravis, Lambert-Eaton syndrome, or ALS.  It is also possible that as with any injection, there may be an allergic reaction or no  effect from the medication. Reduced effectiveness after repeated injections is sometimes seen and rarely infection at the injection site may occur. All care will be taken to prevent these side effects. If therapy is given over a long time, atrophy and wasting in the muscle injected may occur. Occasionally the patient's become refractory to treatment because they develop antibodies to the toxin. In this event, therapy needs to be modified.  I  have read the above information and consent to the administration of botulism toxin.    BOTOX PROCEDURE NOTE FOR MIGRAINE HEADACHE  Contraindications and precautions discussed with patient(above). Aseptic procedure was observed and patient tolerated procedure. Procedure performed by Shawnie Dapper, FNP-C.   The condition has existed for more than 6 months, and pt does not have a diagnosis of ALS, Myasthenia Gravis or Lambert-Eaton Syndrome.  Risks and benefits of injections discussed and pt agrees to proceed with the procedure.  Written consent obtained  These injections are medically necessary. Pt  receives good benefits from these injections. These injections do not cause sedations or hallucinations which the oral therapies may cause.   Description of procedure:  The patient was placed in a sitting position. The standard protocol was used for Botox as follows, with 5 units of Botox injected at each site:  -Procerus muscle, midline injection  -Corrugator muscle, bilateral injection  -Frontalis muscle, bilateral injection, with 2 sites each side, medial injection was performed in the upper one third of the frontalis muscle, in the region vertical from the medial inferior edge of the superior orbital rim. The lateral injection was again in the upper one third of the forehead vertically above the lateral limbus of the cornea, 1.5 cm lateral to the medial injection site.  -Temporalis muscle injection, 4 sites, bilaterally. The first injection was 3 cm above the tragus of the ear, second injection site was 1.5 cm to 3 cm up from the first injection site in line with the tragus of the ear. The third injection site was 1.5-3 cm forward between the first 2 injection sites. The fourth injection site was 1.5 cm posterior to the second injection site. 5th site laterally in the temporalis  muscleat the level of the outer canthus.  -Occipitalis muscle injection, 3 sites, bilaterally. The first injection was  done one half way between the occipital protuberance and the tip of the mastoid process behind the ear. The second injection site was done lateral and superior to the first, 1 fingerbreadth from the first injection. The third injection site was 1 fingerbreadth superiorly and medially from the first injection site.  -Cervical paraspinal muscle injection, 2 sites, bilaterally. The first injection site was 1 cm from the midline of the cervical spine, 3 cm inferior to the lower border of the occipital protuberance. The second injection site was 1.5 cm superiorly and laterally to the first injection site.  -Trapezius muscle injection was performed at 3 sites, bilaterally. The first injection site was in the upper trapezius muscle halfway between the inflection point of the neck, and the acromion. The second injection site was one half way between the acromion and the first injection site. The third injection was done between the first injection site and the inflection point of the neck.     Will return for repeat injection in 3 months.   A total of 155 units of Botox was used, 45U Botox not injected was wasted. The patient tolerated the procedure well, there were no complications of the above procedure.

## 2023-11-02 NOTE — Telephone Encounter (Signed)
Kimberly Simpson for [employer] state plan was approved.  [employer] auth#: [id number] (10/13/23-10/11/24)

## 2023-11-05 ENCOUNTER — Other Ambulatory Visit: Payer: Self-pay | Admitting: Neurology

## 2023-11-05 ENCOUNTER — Ambulatory Visit (INDEPENDENT_AMBULATORY_CARE_PROVIDER_SITE_OTHER): Payer: 59 | Admitting: Neurology

## 2023-11-05 VITALS — BP 106/73

## 2023-11-05 DIAGNOSIS — G43709 Chronic migraine without aura, not intractable, without status migrainosus: Secondary | ICD-10-CM

## 2023-11-05 DIAGNOSIS — G43009 Migraine without aura, not intractable, without status migrainosus: Secondary | ICD-10-CM

## 2023-11-05 MED ORDER — EMGALITY 120 MG/ML ~~LOC~~ SOAJ
240.0000 mg | Freq: Once | SUBCUTANEOUS | 0 refills | Status: DC
Start: 2023-11-05 — End: 2024-02-04

## 2023-11-05 MED ORDER — EMGALITY 120 MG/ML ~~LOC~~ SOAJ
120.0000 mg | SUBCUTANEOUS | 11 refills | Status: DC
Start: 2023-11-05 — End: 2024-01-31

## 2023-11-05 MED ORDER — TOPIRAMATE 100 MG PO TABS
100.0000 mg | ORAL_TABLET | Freq: Every evening | ORAL | 4 refills | Status: DC
Start: 2023-11-05 — End: 2024-05-12

## 2023-11-05 MED ORDER — ONABOTULINUMTOXINA 200 UNITS IJ SOLR
200.0000 [IU] | Freq: Once | INTRAMUSCULAR | Status: AC
Start: 2023-11-05 — End: 2023-11-05
  Administered 2023-11-05: 155 [IU] via INTRAMUSCULAR

## 2023-11-05 NOTE — Progress Notes (Signed)
 Botox - 200 units x 1 vial Lot: [id number] Expiration: 2027/04 NDC: 0023-3921-02  Bacteriostatic 0.9% Sodium Chloride - 4mL  Lot: [id number] Expiration: 07/30/24 NDC: 5427062376  Dx:  E83.151   B/B Witnessed by Lissa Riding RN

## 2023-11-05 NOTE — Progress Notes (Signed)
 Patient stays with Dr. Ines. Sees Olam Blackwater. Prescribe Emgality .     11/05/2023: Patient is stable. She gets >50% improvement in migraines with botox . Unfortunately she ha residual migraines of 8 a month and 15 total headache days a month. Will layer on emgality  for remainder.    08/11/2023: patient feels her masseters worsen her migraines I place 5 units in each, place the botox  high in the forehead.  Stable, doing well  05/13/2023: prescribe zavzpret  for acute migraines in the morning. She is doing very well 4 migraines a month and < 10 total headache days a month, >50% improvement in freq and severity and duration.    02/16/2023: stable :Recommend trying to get [facility]. She is BMI > 30, she has failed a weight loss proram for 6 months and not los 5% weight.  11/18/2022: Kimberly Simpson is a 48 y.o. female here as requested by [facility]* for migraines. Getting botox  and doing very well here for years. At baseline was having >15 headache days a month and > 8-10 moderate to severe migraine days a month and milder migraines as well. We initiated Botox  on top of her other medications and it went down to >70% improvement in freq and severity of migraines and headaches. She also stopped alcohol. She has been on Topamax  for years and we decreased it by 1/2 on 11/28. She take nurtec and relpax  and it helps. She has 4 migraine days a month and < 10 total headache days a month failed imitrex, maxalt, relpax .On topamax . Started Nurtec last month.   08/26/2022: stable doing great cut topamax  in 1/2 try nurtec - gave sample  05/26/2022: stable:   02/17/2022: stable, >>90% improvement in migraine freq and severity  11/19/2021: Stable  08/09/2021 She continues Botox , topiramate  and Relpax . She is doing well. Not having headaches. She rarely has migraines until the week prior to Botox .she sees lisa poulos, was the one who connected us . 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 Do the botox  high in the forehead she has a heavy brow.  Consent Form Botulism Toxin Injection For Chronic Migraine    Reviewed orally with patient, additionally signature is on file:  Botulism toxin has been approved by the Federal drug administration for treatment of chronic migraine. Botulism toxin does not cure chronic migraine and it may not be effective in some patients.  The administration of botulism toxin is accomplished by injecting a small amount of toxin into the muscles of the neck and head. Dosage must be titrated for each individual. Any benefits resulting from botulism toxin tend to wear off after 3 months with a repeat injection required if benefit is to be maintained. Injections are usually done every 3-4 months with maximum effect peak achieved by about 2 or 3 weeks. Botulism toxin is expensive and you should be sure of what costs you will incur resulting from the injection.  The side effects of botulism toxin use for chronic migraine may include:   -Transient, and usually mild, facial weakness with facial injections  -Transient, and usually mild, head or neck weakness with head/neck injections  -Reduction or loss of forehead facial animation due to forehead muscle weakness  -Eyelid drooping  -Dry eye  -Pain at the site of injection or bruising at the site of injection  -Double vision  -Potential unknown long term risks   Contraindications: You should not  have Botox  if you are pregnant, nursing, allergic to albumin, have an infection, skin condition, or muscle weakness at the site of the injection, or have myasthenia gravis, Lambert-Eaton syndrome, or ALS.  It is also possible that as with any injection, there may be an allergic reaction or no effect from the medication. Reduced effectiveness after repeated injections is sometimes seen and rarely infection at the injection site may occur. All care will be  taken to prevent these side effects. If therapy is given over a long time, atrophy and wasting in the muscle injected may occur. Occasionally the patient's become refractory to treatment because they develop antibodies to the toxin. In this event, therapy needs to be modified.  I have read the above information and consent to the administration of botulism toxin.    BOTOX  PROCEDURE NOTE FOR MIGRAINE HEADACHE  Contraindications and precautions discussed with patient(above). Aseptic procedure was observed and patient tolerated procedure. Procedure performed by Greig Forbes, FNP-C.   The condition has existed for more than 6 months, and pt does not have a diagnosis of ALS, Myasthenia Gravis or Lambert-Eaton Syndrome.  Risks and benefits of injections discussed and pt agrees to proceed with the procedure.  Written consent obtained  These injections are medically necessary. Pt  receives good benefits from these injections. These injections do not cause sedations or hallucinations which the oral therapies may cause.   Description of procedure:  The patient was placed in a sitting position. The standard protocol was used for Botox  as follows, with 5 units of Botox  injected at each site:  -Procerus muscle, midline injection  -Corrugator muscle, bilateral injection  -Frontalis muscle, bilateral injection, with 2 sites each side, medial injection was performed in the upper one third of the frontalis muscle, in the region vertical from the medial inferior edge of the superior orbital rim. The lateral injection was again in the upper one third of the forehead vertically above the lateral limbus of the cornea, 1.5 cm lateral to the medial injection site.  -Temporalis muscle injection, 4 sites, bilaterally. The first injection was 3 cm above the tragus of the ear, second injection site was 1.5 cm to 3 cm up from the first injection site in line with the tragus of the ear. The third injection site was 1.5-3  cm forward between the first 2 injection sites. The fourth injection site was 1.5 cm posterior to the second injection site. 5th site laterally in the temporalis  muscleat the level of the outer canthus.  -Occipitalis muscle injection, 3 sites, bilaterally. The first injection was done one half way between the occipital protuberance and the tip of the mastoid process behind the ear. The second injection site was done lateral and superior to the first, 1 fingerbreadth from the first injection. The third injection site was 1 fingerbreadth superiorly and medially from the first injection site.  -Cervical paraspinal muscle injection, 2 sites, bilaterally. The first injection site was 1 cm from the midline of the cervical spine, 3 cm inferior to the lower border of the occipital protuberance. The second injection site was 1.5 cm superiorly and laterally to the first injection site.  -Trapezius muscle injection was performed at 3 sites, bilaterally. The first injection site was in the upper trapezius muscle halfway between the inflection point of the neck, and the acromion. The second injection site was one half way between the acromion and the first injection site. The third injection was done between the first injection site and the inflection  point of the neck.     Will return for repeat injection in 3 months.   A total of 155 units of Botox  was used, 45U Botox  not injected was wasted. The patient tolerated the procedure well, there were no complications of the above procedure.

## 2023-11-06 ENCOUNTER — Telehealth: Payer: Self-pay

## 2023-11-06 ENCOUNTER — Other Ambulatory Visit (HOSPITAL_COMMUNITY): Payer: Self-pay

## 2023-11-06 NOTE — Telephone Encounter (Signed)
Clinical questions have been submitted-awaiting determination. 

## 2023-11-06 NOTE — Telephone Encounter (Signed)
 Pharmacy Patient Advocate Encounter   Received notification from RX Request Messages that prior authorization for Emgality  120MG /ML auto-injectors (migraine) is required/requested.   Insurance verification completed.   The patient is insured through [facility] .   Per test claim: PA required; PA started via CoverMyMeds. KEY [id number] . Waiting for clinical questions to populate.

## 2023-12-23 ENCOUNTER — Other Ambulatory Visit (HOSPITAL_COMMUNITY): Payer: Self-pay

## 2023-12-23 NOTE — Telephone Encounter (Signed)
   Appears to have a successful paid claim.

## 2023-12-29 ENCOUNTER — Telehealth: Payer: Self-pay | Admitting: Neurology

## 2023-12-29 NOTE — Telephone Encounter (Signed)
 Submitted Botox auth renewal to [employer] via [employer], status is pending.

## 2023-12-30 NOTE — Telephone Encounter (Signed)
 Received response from [employer] that Berkley Harvey is not required when they are secondary insurance, only if they were primary. Pt has approval through primary [employer]. Reference # [id number].

## 2024-01-28 ENCOUNTER — Ambulatory Visit: Payer: 59 | Admitting: Neurology

## 2024-01-28 DIAGNOSIS — G43709 Chronic migraine without aura, not intractable, without status migrainosus: Secondary | ICD-10-CM

## 2024-01-28 DIAGNOSIS — G43009 Migraine without aura, not intractable, without status migrainosus: Secondary | ICD-10-CM

## 2024-01-28 MED ORDER — ONABOTULINUMTOXINA 200 UNITS IJ SOLR
155.0000 [IU] | Freq: Once | INTRAMUSCULAR | Status: AC
Start: 2024-01-28 — End: 2024-01-28
  Administered 2024-01-28: 155 [IU] via INTRAMUSCULAR

## 2024-01-28 NOTE — Progress Notes (Unsigned)
 01/31/2024: She is doing very well 4 migraines a month and < 10 total headache days a month, >50% improvement in freq and severity and duration.  01/28/2024: No masseters.  11/05/2023: stable 08/11/2023: patient feels her masseters worsen her migraines I place 5 units in each, place the botox  high in the forehead.  Stable, doing well  05/13/2023: prescribe zavzpret  for acute migraines in the morning. She is doing very well 4 migraines a month and < 10 total headache days a month, >50% improvement in freq and severity and duration.    02/16/2023: stable :Recommend trying to get [facility]. She is BMI > 30, she has failed a weight loss "proram" for 6 months and not los 5% weight.  11/18/2022: Kimberly Simpson is a 48 y.o. female here as requested by [facility] M* for migraines. Getting botox  and doing very well here for years. At baseline was having >15 headache days a month and > 8-10 moderate to severe migraine days a month and milder migraines as well. We initiated Botox  on top of her other medications and it went down to >70% improvement in freq and severity of migraines and headaches. She also stopped alcohol. She has been on Topamax  for years and we decreased it by 1/2 on 11/28. She take nurtec and relpax  and it helps. She has 4 migraine days a month and < 10 total headache days a month failed imitrex, maxalt, relpax .On topamax . Started Nurtec last month.   08/26/2022: stable doing great cut topamax  in 1/2 try nurtec - gave sample  05/26/2022: stable:   02/17/2022: stable, >>90% improvement in migraine freq and severity  11/19/2021: Stable  08/09/2021 She continues Botox , topiramate  and Relpax . She is doing well. Not having headaches. She rarely has migraines until the week prior to Botox .she sees lisa poulos, was the one who connected us . 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 Do the botox  high in the  forehead she has a heavy brow.  Consent Form Botulism Toxin Injection For Chronic Migraine    Reviewed orally with patient, additionally signature is on file:  Botulism toxin has been approved by the Federal drug administration for treatment of chronic migraine. Botulism toxin does not cure chronic migraine and it may not be effective in some patients.  The administration of botulism toxin is accomplished by injecting a small amount of toxin into the muscles of the neck and head. Dosage must be titrated for each individual. Any benefits resulting from botulism toxin tend to wear off after 3 months with a repeat injection required if benefit is to be maintained. Injections are usually done every 3-4 months with maximum effect peak achieved by about 2 or 3 weeks. Botulism toxin is expensive and you should be sure of what costs you will incur resulting from the injection.  The side effects of botulism toxin use for chronic migraine may include:   -Transient, and usually mild, facial weakness with facial injections  -Transient, and usually mild, head or neck weakness with head/neck injections  -Reduction or loss of forehead facial animation due to forehead muscle weakness  -Eyelid drooping  -Dry eye  -Pain at the site of injection or bruising at the site of injection  -Double vision  -Potential unknown long term risks   Contraindications: You should not have Botox  if you are pregnant, nursing, allergic to albumin, have an infection, skin condition, or  muscle weakness at the site of the injection, or have myasthenia gravis, Lambert-Eaton syndrome, or ALS.  It is also possible that as with any injection, there may be an allergic reaction or no effect from the medication. Reduced effectiveness after repeated injections is sometimes seen and rarely infection at the injection site may occur. All care will be taken to prevent these side effects. If therapy is given over a long time, atrophy and  wasting in the muscle injected may occur. Occasionally the patient's become refractory to treatment because they develop antibodies to the toxin. In this event, therapy needs to be modified.  I have read the above information and consent to the administration of botulism toxin.    BOTOX  PROCEDURE NOTE FOR MIGRAINE HEADACHE  Contraindications and precautions discussed with patient(above). Aseptic procedure was observed and patient tolerated procedure. Procedure performed by Terrilyn Fick, FNP-C.   The condition has existed for more than 6 months, and pt does not have a diagnosis of ALS, Myasthenia Gravis or Lambert-Eaton Syndrome.  Risks and benefits of injections discussed and pt agrees to proceed with the procedure.  Written consent obtained  These injections are medically necessary. Pt  receives good benefits from these injections. These injections do not cause sedations or hallucinations which the oral therapies may cause.   Description of procedure:  The patient was placed in a sitting position. The standard protocol was used for Botox  as follows, with 5 units of Botox  injected at each site:  -Procerus muscle, midline injection  -Corrugator muscle, bilateral injection  -Frontalis muscle, bilateral injection, with 2 sites each side, medial injection was performed in the upper one third of the frontalis muscle, in the region vertical from the medial inferior edge of the superior orbital rim. The lateral injection was again in the upper one third of the forehead vertically above the lateral limbus of the cornea, 1.5 cm lateral to the medial injection site.  -Temporalis muscle injection, 4 sites, bilaterally. The first injection was 3 cm above the tragus of the ear, second injection site was 1.5 cm to 3 cm up from the first injection site in line with the tragus of the ear. The third injection site was 1.5-3 cm forward between the first 2 injection sites. The fourth injection site was 1.5 cm  posterior to the second injection site. 5th site laterally in the temporalis  muscleat the level of the outer canthus.  -Occipitalis muscle injection, 3 sites, bilaterally. The first injection was done one half way between the occipital protuberance and the tip of the mastoid process behind the ear. The second injection site was done lateral and superior to the first, 1 fingerbreadth from the first injection. The third injection site was 1 fingerbreadth superiorly and medially from the first injection site.  -Cervical paraspinal muscle injection, 2 sites, bilaterally. The first injection site was 1 cm from the midline of the cervical spine, 3 cm inferior to the lower border of the occipital protuberance. The second injection site was 1.5 cm superiorly and laterally to the first injection site.  -Trapezius muscle injection was performed at 3 sites, bilaterally. The first injection site was in the upper trapezius muscle halfway between the inflection point of the neck, and the acromion. The second injection site was one half way between the acromion and the first injection site. The third injection was done between the first injection site and the inflection point of the neck.     Will return for repeat injection in 3 months.  A total of 155 units of Botox  was used, 45U Botox  not injected was wasted. The patient tolerated the procedure well, there were no complications of the above procedure.

## 2024-01-28 NOTE — Progress Notes (Unsigned)
 Botox - 200 units x 1 vial Lot: [id number] Expiration: 06/2026 NDC: 5409-8119-14  Bacteriostatic 0.9% Sodium Chloride - 5 mL  Lot: [id number] Expiration: 07/30/2024 NDC: 2130-8657-84  Dx: O96.295 B/B Witnessed by Inocencio Mania, RN

## 2024-01-31 MED ORDER — NURTEC 75 MG PO TBDP
75.0000 mg | ORAL_TABLET | Freq: Every day | ORAL | 11 refills | Status: DC | PRN
Start: 2024-01-31 — End: 2024-05-12

## 2024-01-31 MED ORDER — EMGALITY 120 MG/ML ~~LOC~~ SOAJ
120.0000 mg | SUBCUTANEOUS | 11 refills | Status: DC
Start: 2024-01-31 — End: 2024-07-19

## 2024-02-04 ENCOUNTER — Encounter: Payer: Self-pay | Admitting: *Deleted

## 2024-02-04 DIAGNOSIS — G43709 Chronic migraine without aura, not intractable, without status migrainosus: Secondary | ICD-10-CM

## 2024-02-04 MED ORDER — EMGALITY 120 MG/ML ~~LOC~~ SOAJ
240.0000 mg | Freq: Once | SUBCUTANEOUS | 0 refills | Status: AC
Start: 2024-02-04 — End: 2024-02-04

## 2024-02-04 NOTE — Telephone Encounter (Signed)
 [facility] called wanting to clarify if the pt is needing a starter dose. Please call back to inform  [phone] Just give pt name and D.O.B. and if a fax number is needed it is [phone]

## 2024-02-04 NOTE — Telephone Encounter (Signed)
 The fax we received said we could e-scribe the start pack if it is needed. I have done this.

## 2024-04-04 ENCOUNTER — Other Ambulatory Visit: Payer: Self-pay | Admitting: Neurology

## 2024-04-04 ENCOUNTER — Encounter: Payer: Self-pay | Admitting: Neurology

## 2024-04-05 ENCOUNTER — Other Ambulatory Visit: Payer: Self-pay | Admitting: *Deleted

## 2024-04-05 ENCOUNTER — Encounter: Payer: Self-pay | Admitting: Neurology

## 2024-04-05 NOTE — Telephone Encounter (Signed)
 Duplicate request. Pt also sent same request 04/05/24 in a mychart message

## 2024-04-27 ENCOUNTER — Ambulatory Visit (INDEPENDENT_AMBULATORY_CARE_PROVIDER_SITE_OTHER): Admitting: Neurology

## 2024-04-27 VITALS — BP 124/78 | HR 96

## 2024-04-27 DIAGNOSIS — G43709 Chronic migraine without aura, not intractable, without status migrainosus: Secondary | ICD-10-CM

## 2024-04-27 MED ORDER — ONABOTULINUMTOXINA 200 UNITS IJ SOLR
155.0000 [IU] | Freq: Once | INTRAMUSCULAR | Status: AC
Start: 2024-04-27 — End: 2024-04-27
  Administered 2024-04-27: 155 [IU] via INTRAMUSCULAR

## 2024-04-27 NOTE — Progress Notes (Signed)
 Botox - 200 units x 1 vial Lot: [id number]  Expiration: 06/2026 NDC: 9976-6078-97  Bacteriostatic 0.9% Sodium Chloride - 4 mL  Lot: [id number] Expiration: 06/2025 NDC: 9590-8033-98  Dx: H56.290  B/B Witnessed by Heather PEAK

## 2024-04-27 NOTE — Progress Notes (Signed)
 04/27/2024: stable and doing well.  01/31/2024: She is doing very well 4 migraines a month and < 10 total headache days a month, >50% improvement in freq and severity and duration.  01/28/2024: No masseters.  11/05/2023: stable 08/11/2023: patient feels her masseters worsen her migraines I place 5 units in each, place the botox  high in the forehead.  Stable, doing well  05/13/2023: prescribe zavzpret  for acute migraines in the morning. She is doing very well 4 migraines a month and < 10 total headache days a month, >50% improvement in freq and severity and duration.    02/16/2023: stable :Recommend trying to get [facility]. She is BMI > 30, she has failed a weight loss proram for 6 months and not los 5% weight.  11/18/2022: Kimberly Simpson is a 48 y.o. female here as requested by Marvetta Ee Family M* for migraines. Getting botox  and doing very well here for years. At baseline was having >15 headache days a month and > 8-10 moderate to severe migraine days a month and milder migraines as well. We initiated Botox  on top of her other medications and it went down to >70% improvement in freq and severity of migraines and headaches. She also stopped alcohol. She has been on Topamax  for years and we decreased it by 1/2 on 11/28. She take nurtec and relpax  and it helps. She has 4 migraine days a month and < 10 total headache days a month failed imitrex, maxalt, relpax .On topamax . Started Nurtec last month.   08/26/2022: stable doing great cut topamax  in 1/2 try nurtec - gave sample  05/26/2022: stable:   02/17/2022: stable, >>90% improvement in migraine freq and severity  11/19/2021: Stable  08/09/2021 She continues Botox , topiramate  and Relpax . She is doing well. Not having headaches. She rarely has migraines until the week prior to Botox .she sees lisa poulos, was the one who connected us . She has a son in [place]  now working for the stadium and is going to see Waddell swift, she has another graduating next  [employer]. Do the botox  high in the forehead she has a heavy brow.  Consent Form Botulism Toxin Injection For Chronic Migraine    Reviewed orally with patient, additionally signature is on file:  Botulism toxin has been approved by the Federal drug administration for treatment of chronic migraine. Botulism toxin does not cure chronic migraine and it may not be effective in some patients.  The administration of botulism toxin is accomplished by injecting a small amount of toxin into the muscles of the neck and head. Dosage must be titrated for each individual. Any benefits resulting from botulism toxin tend to wear off after 3 months with a repeat injection required if benefit is to be maintained. Injections are usually done every 3-4 months with maximum effect peak achieved by about 2 or 3 weeks. Botulism toxin is expensive and you should be sure of what costs you will incur resulting from the injection.  The side effects of botulism toxin use for chronic migraine may include:   -Transient, and usually mild, facial weakness with facial injections  -Transient, and usually mild, head or neck weakness with head/neck injections  -Reduction or loss of forehead facial animation due to forehead muscle weakness  -Eyelid drooping  -Dry eye  -Pain at the site of injection or bruising at the site of injection  -Double vision  -Potential unknown long term risks   Contraindications: You should not have Botox  if you are pregnant, nursing, allergic to albumin,  have an infection, skin condition, or muscle weakness at the site of the injection, or have myasthenia gravis, Lambert-Eaton syndrome, or ALS.  It is also possible that as with any injection, there may be an allergic reaction or no effect from the medication. Reduced effectiveness after repeated injections is sometimes seen and rarely infection at the injection site may occur. All care will be taken to prevent these side effects. If therapy is given  over a long time, atrophy and wasting in the muscle injected may occur. Occasionally the patient's become refractory to treatment because they develop antibodies to the toxin. In this event, therapy needs to be modified.  I have read the above information and consent to the administration of botulism toxin.    BOTOX  PROCEDURE NOTE FOR MIGRAINE HEADACHE  Contraindications and precautions discussed with patient(above). Aseptic procedure was observed and patient tolerated procedure. Procedure performed by Greig Forbes, FNP-C.   The condition has existed for more than 6 months, and pt does not have a diagnosis of ALS, Myasthenia Gravis or Lambert-Eaton Syndrome.  Risks and benefits of injections discussed and pt agrees to proceed with the procedure.  Written consent obtained  These injections are medically necessary. Pt  receives good benefits from these injections. These injections do not cause sedations or hallucinations which the oral therapies may cause.   Description of procedure:  The patient was placed in a sitting position. The standard protocol was used for Botox  as follows, with 5 units of Botox  injected at each site:  -Procerus muscle, midline injection  -Corrugator muscle, bilateral injection  -Frontalis muscle, bilateral injection, with 2 sites each side, medial injection was performed in the upper one third of the frontalis muscle, in the region vertical from the medial inferior edge of the superior orbital rim. The lateral injection was again in the upper one third of the forehead vertically above the lateral limbus of the cornea, 1.5 cm lateral to the medial injection site.  -Temporalis muscle injection, 4 sites, bilaterally. The first injection was 3 cm above the tragus of the ear, second injection site was 1.5 cm to 3 cm up from the first injection site in line with the tragus of the ear. The third injection site was 1.5-3 cm forward between the first 2 injection sites. The fourth  injection site was 1.5 cm posterior to the second injection site. 5th site laterally in the temporalis  muscleat the level of the outer canthus.  -Occipitalis muscle injection, 3 sites, bilaterally. The first injection was done one half way between the occipital protuberance and the tip of the mastoid process behind the ear. The second injection site was done lateral and superior to the first, 1 fingerbreadth from the first injection. The third injection site was 1 fingerbreadth superiorly and medially from the first injection site.  -Cervical paraspinal muscle injection, 2 sites, bilaterally. The first injection site was 1 cm from the midline of the cervical spine, 3 cm inferior to the lower border of the occipital protuberance. The second injection site was 1.5 cm superiorly and laterally to the first injection site.  -Trapezius muscle injection was performed at 3 sites, bilaterally. The first injection site was in the upper trapezius muscle halfway between the inflection point of the neck, and the acromion. The second injection site was one half way between the acromion and the first injection site. The third injection was done between the first injection site and the inflection point of the neck.     Will return  for repeat injection in 3 months.   A total of 155 units of Botox  was used, 45U Botox  not injected was wasted. The patient tolerated the procedure well, there were no complications of the above procedure.

## 2024-05-10 ENCOUNTER — Telehealth (INDEPENDENT_AMBULATORY_CARE_PROVIDER_SITE_OTHER): Admitting: Neurology

## 2024-05-10 DIAGNOSIS — G43009 Migraine without aura, not intractable, without status migrainosus: Secondary | ICD-10-CM | POA: Diagnosis not present

## 2024-05-10 MED ORDER — ELETRIPTAN HYDROBROMIDE 40 MG PO TABS
40.0000 mg | ORAL_TABLET | ORAL | 11 refills | Status: DC | PRN
Start: 2024-05-10 — End: 2024-05-11

## 2024-05-10 MED ORDER — ONDANSETRON 4 MG PO TBDP
4.0000 mg | ORAL_TABLET | Freq: Three times a day (TID) | ORAL | 6 refills | Status: DC | PRN
Start: 2024-05-10 — End: 2024-05-12

## 2024-05-10 NOTE — Progress Notes (Signed)
 [facility]    Provider:  Dr Ines Requesting Provider: Dayna Motto, DO Primary Care Provider:  Dayna Motto, DO  CC:  migraines  Virtual Visit via Video Note  I connected with ATHALIA SETTERLUND on 05/10/2022 at  1:30 PM EDT by a video enabled telemedicine application and verified that I am speaking with the correct person using two identifiers.  Location: Patient: home Provider: office   I discussed the limitations of evaluation and management by telemedicine and the availability of in person appointments. The patient expressed understanding and agreed to proceed.   Follow Up Instructions:    I discussed the assessment and treatment plan with the patient. The patient was provided an opportunity to ask questions and all were answered. The patient agreed with the plan and demonstrated an understanding of the instructions.   The patient was advised to call back or seek an in-person evaluation if the symptoms worsen or if the condition fails to improve as anticipated.  I provided 11 minutes of non-face-to-face time during this encounter.   Onetha KATHEE Ines, MD   05/10/2022: shirlean well needs refills  Follow up:   DORITA ROWLANDS is a 48 y.o. female here as requested by Dayna Motto, DO for migraines. Getting botox  and doing very well here for years. At baseline was having >15 headache days a month and > 8-10 moderate to severe migraine days a month and milder migraines as well. We initiated Botox  on top of her other medications and it went down to >70% improvement in freq and severity of migraines and headaches. She also stopped alcohol. She has been on Topamax  for years and we decreased it by 1/2 on 11/28. She take nurtec and relpax  and it helps. She has 4 migraine days a month and < 10 total headache days a month failed imitrex, maxalt, relpax .   Current and past medications reviewed EOIC and CARE EVERYWHERE and prior neuro notes ANALGESICS: tylenol ,  tylenol  #3, Darvocet, Excedrin, Hydrocodone , Tylenol , ibuprofen, mobic, tramadol  ANTI-MIGRAINE: imitrex tablet, relpax , Maxalt HEART/BP: atenolol DECONGESTANT/ANTIHISTAMINE: Benadryl, Flonase, Sudafed ANTI-NAUSEANT: Dramamine, Phenergan , Zofran  NSAIDS: ibuprofen, aleve, mobic MUSCLE RELAXANTS: Flexeril, Skelaxin, Baclofen, chlorzoxazone (not helpful), methocarbamol (slightly more helpful than chlorzoxazone) ANTI-CONVULSANTS: Lyrica (no adverse effects, would re-try), Topamax  (cognitive),gabapentin, lamictal STEROIDS: Decadron  SLEEPING PILLS/TRANQUILIZERS: ANTI-DEPRESSANTS: Wellbutrin, Zoloft, Cymbalta, Effexor, Pristiq, amitriptyline HERBAL: Mg, B2 FIBROMYALGIA:  HORMONAL: OTHER: ma, b2 PROCEDURES FOR HEADACHES: botox , Aimovig, Ubrelvy, Nurtec   Review of Systems: Patient complains of symptoms per HPI as well as the following symptoms migraines. Pertinent negatives and positives per HPI. All others negative.   Social History   Socioeconomic History   Marital status: Widowed    Spouse name: Not on file   Number of children: 2   Years of education: college   Highest education level: Not on file  Occupational History   Occupation: Teacher  Tobacco Use   Smoking status: Former    Current packs/day: 0.00    Types: Cigarettes    Quit date: 03/18/1997    Years since quitting: 27.1   Smokeless tobacco: Never  Vaping Use   Vaping status: Never Used  Substance and Sexual Activity   Alcohol use: Yes    Comment: rarely   Drug use: No   Sexual activity: Yes    Birth control/protection: I.U.D.  Other Topics Concern   Not on file  Social History Narrative   Lives at home with her older son.   Right-handed.   1-2 cups caffeine daily.  Social Drivers of Corporate investment banker Strain: Not on file  Food Insecurity: Not on file  Transportation Needs: Not on file  Physical Activity: Not on file  Stress: Not on file  Social Connections: Unknown (02/09/2022)   Received [facility]   Social Network    Social Network: Not on file  Intimate Partner Violence: Unknown (01/01/2022)   Received from [facility]   HITS    Physically Hurt: Not on file    Insult or Talk Down To: Not on file    Threaten Physical Harm: Not on file    Scream or Curse: Not on file    Family History  Problem Relation Age of Onset   Hypertension Mother    Hyperlipidemia Father    Rheum arthritis Father    Hypertension Paternal Grandmother    Heart failure Paternal Grandmother     Past Medical History:  Diagnosis Date   Anxiety    Back pain    Cervical pain    Complication of anesthesia    headaches and BP drops at times   Depression    Fibromyalgia    GERD (gastroesophageal reflux disease)    IBS (irritable bowel syndrome)    Migraines     Patient Active Problem List   Diagnosis Date Noted   Migraine without aura and without status migrainosus, not intractable 09/15/2022   Admission for adjustment of gastric lap band 03/23/2014   Gastric band slippage 03/22/2014   surgical takedown of plication and anterior slip and replication June 2015 03/22/2014   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   Left ankle sprain 12/15/2013   Injury of left shoulder 12/15/2013   Lapband APS + [facility] repair April 2009 11/02/2012    Past Surgical History:  Procedure Laterality Date   BACK SURGERY     disc rupture and fusion   CARPAL TUNNEL RELEASE Right 04/2017   INTRAUTERINE DEVICE INSERTION     LAPAROSCOPIC GASTRIC BANDING  12/30/2007   LAPAROSCOPIC REVISION OF GASTRIC BAND N/A 03/22/2014   Procedure: LAPAROSCOPIC REVISION OF GASTRIC BAND;  Surgeon: Donnice KATHEE Lunger, MD;  Location: [facility];  Service: General;  Laterality: N/A;   left knee surgery     lateral release    SPINE SURGERY     TONSILLECTOMY     TYMPANOSTOMY TUBE PLACEMENT     UPPER GI ENDOSCOPY  03/22/2014   Procedure: UPPER GI ENDOSCOPY;  Surgeon: Donnice KATHEE Lunger, MD;  Location: [facility];  Service: General;;    Current  Outpatient Medications  Medication Sig Dispense Refill   botulinum toxin Type A  (BOTOX ) 100 units SOLR injection Inject 155 units into the muscles of the head and neck. To be injected by the provider. Discard remainder. 2 each 3   clonazePAM (KLONOPIN) 0.5 MG tablet Take 0.5-1 mg by mouth 2 (two) times daily as needed for anxiety. (Patient taking differently: Take 1 mg by mouth 2 (two) times daily as needed for anxiety.)     desvenlafaxine (PRISTIQ) 100 MG 24 hr tablet      desvenlafaxine (PRISTIQ) 50 MG 24 hr tablet Take 1 tablet by mouth daily. 100mg  tablet  and 50 mg    daily     eletriptan  (RELPAX ) 40 MG tablet Take 1 tablet (40 mg total) by mouth every 2 (two) hours as needed for migraine or headache. Maximum 2 doses a day. Thank you 10 tablet 11   Galcanezumab -gnlm (EMGALITY ) 120 MG/ML SOAJ Inject 120 mg into the skin  every 30 (thirty) days. (Patient not taking: Reported on 04/27/2024) 1.12 mL 11   lamoTRIgine (LAMICTAL) 150 MG tablet Take 150 mg by mouth daily.     Melatonin 3 MG TBDP Take 3 mg by mouth at bedtime.     Multiple Vitamin (MULTIVITAMIN) tablet Take 1 tablet by mouth daily.     ondansetron  (ZOFRAN -ODT) 4 MG disintegrating tablet Take 1 tablet (4 mg total) by mouth every 8 (eight) hours as needed for nausea. 30 tablet 6   Rimegepant Sulfate (NURTEC) 75 MG TBDP Take 1 tablet (75 mg total) by mouth daily as needed. For migraines. Take as close to onset of migraine as possible. One daily maximum. 16 tablet 4   Rimegepant Sulfate (NURTEC) 75 MG TBDP Take 1 tablet (75 mg total) by mouth daily as needed. For migraines. Take as close to onset of migraine as possible. One daily maximum. 16 tablet 11   topiramate  (TOPAMAX ) 100 MG tablet Take 1 tablet (100 mg total) by mouth at bedtime. 90 tablet 4   No current facility-administered medications for this visit.    Allergies as of 05/10/2024 - Review Complete 01/28/2024  Allergen Reaction Noted   Ciprofloxacin Shortness Of Breath and Rash  03/21/2014   Shellfish allergy Hives and Shortness Of Breath 04/08/2017   Other  10/10/2020   Latex Rash 04/08/2017    Vitals: There were no vitals taken for this visit. Last Weight:  Wt Readings from Last 1 Encounters:  03/23/23 191 lb 8 oz (86.9 kg)   Last Height:   Ht Readings from Last 1 Encounters:  03/23/23 5' 4 (1.626 m)    Physical exam: Exam: Gen: NAD, conversant      CV:  Denies palpitations or chest pain or SOB. VS: Breathing at a normal rate. Not febrile. Eyes: Conjunctivae clear without exudates or hemorrhage  Neuro: Detailed Neurologic Exam  Speech:    Speech is normal; fluent and spontaneous with normal comprehension.  Cognition:    The patient is oriented to person, place, and time;     recent and remote memory intact;     language fluent;     normal attention, concentration,     fund of knowledge Cranial Nerves:    The pupils are equal, round, and reactive to light. Visual fields are full to finger confrontation. Extraocular movements are intact.  The face is symmetric with normal sensation. The palate elevates in the midline. Hearing intact. Voice is normal. Shoulder shrug is normal. The tongue has normal motion without fasciculations.   Coordination:    Normal finger to nose  Gait:    Normal native gait  Motor Observation:   no involuntary movements noted. Tone:    Appears normal  Posture:    Posture is normal. normal erect    Strength:    Strength is anti-gravity and symmetric in the upper and lower limbs.      Sensation: intact to LT       Assessment/Plan:  DECKLYN HORNIK is a 48 y.o. female here as requested by Marvetta Ee Family M* for migraines. Getting botox  and doing very well here for years. At baseline was having >15 headache days a month and > 8-10 moderate to severe migraine days a month and milder migraines as well. We initiated Botox  on top of her other medications and it went down to >70% improvement in freq and  severity of migraines and headaches. She also stopped alcohol. She has been on Topamax  for years and we decreased  it by 1/2 on 11/28. She take nurtec and relpax  and it helps. She has 4 migraine days a month and < 10 total headache days a month failed imitrex, maxalt, relpax .  Stay on Topiramate , botox ,relpax ,,zofran ,nurtec At onset of migraine: Relpax , zofran  and nurtec. Repeat Relpax  and zofran  2 hours later At next botox  we can talk some more about how you are doing    Cc: Dayna Motto, DO,  Dayna Motto, DO  Onetha Epp, MD  [facility] [address] [place], [place] [address]  Phone [phone] Fax [phone]

## 2024-05-11 ENCOUNTER — Other Ambulatory Visit: Payer: Self-pay | Admitting: Neurology

## 2024-05-11 DIAGNOSIS — G43009 Migraine without aura, not intractable, without status migrainosus: Secondary | ICD-10-CM

## 2024-05-12 ENCOUNTER — Encounter: Payer: Self-pay | Admitting: Neurology

## 2024-05-12 DIAGNOSIS — G43009 Migraine without aura, not intractable, without status migrainosus: Secondary | ICD-10-CM

## 2024-05-12 DIAGNOSIS — G43709 Chronic migraine without aura, not intractable, without status migrainosus: Secondary | ICD-10-CM

## 2024-05-12 MED ORDER — TOPIRAMATE 100 MG PO TABS
100.0000 mg | ORAL_TABLET | Freq: Every evening | ORAL | 4 refills | Status: AC
Start: 2024-05-12 — End: ?

## 2024-05-12 MED ORDER — ELETRIPTAN HYDROBROMIDE 40 MG PO TABS
40.0000 mg | ORAL_TABLET | ORAL | 11 refills | Status: AC | PRN
Start: 2024-05-12 — End: ?

## 2024-05-12 MED ORDER — NURTEC 75 MG PO TBDP
75.0000 mg | ORAL_TABLET | Freq: Every day | ORAL | 11 refills | Status: AC | PRN
Start: 2024-05-12 — End: ?

## 2024-05-12 MED ORDER — ONDANSETRON 4 MG PO TBDP
4.0000 mg | ORAL_TABLET | Freq: Three times a day (TID) | ORAL | 6 refills | Status: DC | PRN
Start: 2024-05-12 — End: 2024-07-20

## 2024-05-12 NOTE — Telephone Encounter (Addendum)
 Spoke to patient  requested nurtec,topiramate ,zofran  and eletriptan  [facility] [address]  sent this am

## 2024-05-13 ENCOUNTER — Other Ambulatory Visit (HOSPITAL_COMMUNITY): Payer: Self-pay

## 2024-05-13 ENCOUNTER — Telehealth: Payer: Self-pay

## 2024-05-13 NOTE — Telephone Encounter (Signed)
   Per test claim was filled on 05/12/2024. She got 18 as a 6 day supply.

## 2024-05-13 NOTE — Telephone Encounter (Signed)
 Pharmacy Patient Advocate Encounter   Received notification from Fax that prior authorization for Ondansetron  4mg  PDT is required/requested.   Insurance verification completed.   The patient is insured through [facility] .   Per test claim: PA required; PA submitted to above mentioned insurance via Latent Key/confirmation #/EOC [id number] Status is pending

## 2024-06-16 ENCOUNTER — Other Ambulatory Visit (HOSPITAL_COMMUNITY): Payer: Self-pay

## 2024-06-30 ENCOUNTER — Telehealth: Payer: Self-pay | Admitting: Neurology

## 2024-06-30 NOTE — Telephone Encounter (Signed)
 Called pt to discuss changing Botox  appt, she used to get injected by [name] and is agreeable to seeing her again.

## 2024-07-04 ENCOUNTER — Other Ambulatory Visit: Payer: Self-pay | Admitting: *Deleted

## 2024-07-04 ENCOUNTER — Other Ambulatory Visit: Payer: Self-pay

## 2024-07-04 DIAGNOSIS — G43009 Migraine without aura, not intractable, without status migrainosus: Secondary | ICD-10-CM

## 2024-07-04 MED ORDER — ONABOTULINUMTOXINA 200 UNITS IJ SOLR
INTRAMUSCULAR | 2 refills | Status: AC
  Filled 2024-07-04: qty 1, fill #0
  Filled 2024-07-05: qty 1, 84d supply, fill #0
  Filled 2024-10-13: qty 1, 84d supply, fill #1

## 2024-07-04 NOTE — Telephone Encounter (Signed)
 Jillian- can you review this first before we send to [facility]? [facility] but I dont see where we have sent a recent rx to them?

## 2024-07-04 NOTE — Telephone Encounter (Signed)
 Received approval, please send rx to [facility].  Auth#: [id number] (06/29/24-06/29/25)

## 2024-07-05 ENCOUNTER — Other Ambulatory Visit: Payer: Self-pay

## 2024-07-05 ENCOUNTER — Other Ambulatory Visit (HOSPITAL_COMMUNITY): Payer: Self-pay

## 2024-07-05 NOTE — Progress Notes (Signed)
 Specialty Pharmacy Initial Fill Coordination Note  Kimberly Simpson is a 48 y.o. female contacted today regarding initial fill of specialty medication(s) OnabotulinumtoxinA  (BOTOX )   Patient requested Courier to Provider Office   Delivery date: 07/13/24   Verified address: [facility], [address], [address], [place], [place] [address]   Medication will be filled on 07/11/2024.   Patient is aware of $0 copayment.

## 2024-07-12 ENCOUNTER — Other Ambulatory Visit: Payer: Self-pay

## 2024-07-19 NOTE — Progress Notes (Unsigned)
 07/20/2024 ALL: Kimberly Simpson returns for Botox . She was last seen by me 04/2021 and has followed with Dr Ines since. At baseline was having >15 headache days a month and > 8-10 moderate to severe migraine days a month. Now 4 migraine days a month and < 10 total headache days a month failed imitrex, maxalt, relpax . She continues topiramate , ondansetron , Nurtec and eletriptan . She usually takes Nurtec about 6-8 times a month, eletriptan  and ondansetron  2-3 times per month.   05/16/2021 ALL: She continues Botox , topiramate  and Relpax . She is doing well. Not having headaches. She had Covid last week but has recovered well. She did take Paxlovid.   02/07/2021 ALL: She continue to do well on Botox  and topiramate . Relpax  works well for abortive therapy. She rarely has migraines until the week prior to Botox . Unfortunately, she is behind a few weeks due to last appt being canceled due to me being out sick.   Dr Ines prescribed Nurtec 02/2019. Patient does not remember taking this for abortive therapy. Has tried and failed sumatriptan .   10/10/2020 ALL: She is doing well. Headaches have been better since last visit. She has not had to use abortive therapy recently.   Consent Form Botulism Toxin Injection For Chronic Migraine    Reviewed orally with patient, additionally signature is on file:  Botulism toxin has been approved by the Federal drug administration for treatment of chronic migraine. Botulism toxin does not cure chronic migraine and it may not be effective in some patients.  The administration of botulism toxin is accomplished by injecting a small amount of toxin into the muscles of the neck and head. Dosage must be titrated for each individual. Any benefits resulting from botulism toxin tend to wear off after 3 months with a repeat injection required if benefit is to be maintained. Injections are usually done every 3-4 months with maximum effect peak achieved by about 2 or 3 weeks. Botulism toxin is  expensive and you should be sure of what costs you will incur resulting from the injection.  The side effects of botulism toxin use for chronic migraine may include:   -Transient, and usually mild, facial weakness with facial injections  -Transient, and usually mild, head or neck weakness with head/neck injections  -Reduction or loss of forehead facial animation due to forehead muscle weakness  -Eyelid drooping  -Dry eye  -Pain at the site of injection or bruising at the site of injection  -Double vision  -Potential unknown long term risks   Contraindications: You should not have Botox  if you are pregnant, nursing, allergic to albumin, have an infection, skin condition, or muscle weakness at the site of the injection, or have myasthenia gravis, Lambert-Eaton syndrome, or ALS.  It is also possible that as with any injection, there may be an allergic reaction or no effect from the medication. Reduced effectiveness after repeated injections is sometimes seen and rarely infection at the injection site may occur. All care will be taken to prevent these side effects. If therapy is given over a long time, atrophy and wasting in the muscle injected may occur. Occasionally the patient's become refractory to treatment because they develop antibodies to the toxin. In this event, therapy needs to be modified.  I have read the above information and consent to the administration of botulism toxin.    BOTOX  PROCEDURE NOTE FOR MIGRAINE HEADACHE  Contraindications and precautions discussed with patient(above). Aseptic procedure was observed and patient tolerated procedure. Procedure performed by Greig Forbes, FNP-C.  The condition has existed for more than 6 months, and pt does not have a diagnosis of ALS, Myasthenia Gravis or Lambert-Eaton Syndrome.  Risks and benefits of injections discussed and pt agrees to proceed with the procedure.  Written consent obtained  These injections are medically necessary.  Pt  receives good benefits from these injections. These injections do not cause sedations or hallucinations which the oral therapies may cause.   Description of procedure:  The patient was placed in a sitting position. The standard protocol was used for Botox  as follows, with 5 units of Botox  injected at each site:  -Procerus muscle, midline injection  -Corrugator muscle, bilateral injection  -Frontalis muscle, bilateral injection, with 2 sites each side, medial injection was performed in the upper one third of the frontalis muscle, in the region vertical from the medial inferior edge of the superior orbital rim. The lateral injection was again in the upper one third of the forehead vertically above the lateral limbus of the cornea, 1.5 cm lateral to the medial injection site.  -Temporalis muscle injection, 4 sites, bilaterally. The first injection was 3 cm above the tragus of the ear, second injection site was 1.5 cm to 3 cm up from the first injection site in line with the tragus of the ear. The third injection site was 1.5-3 cm forward between the first 2 injection sites. The fourth injection site was 1.5 cm posterior to the second injection site. 5th site laterally in the temporalis  muscleat the level of the outer canthus.  -Occipitalis muscle injection, 3 sites, bilaterally. The first injection was done one half way between the occipital protuberance and the tip of the mastoid process behind the ear. The second injection site was done lateral and superior to the first, 1 fingerbreadth from the first injection. The third injection site was 1 fingerbreadth superiorly and medially from the first injection site.  -Cervical paraspinal muscle injection, 2 sites, bilaterally. The first injection site was 1 cm from the midline of the cervical spine, 3 cm inferior to the lower border of the occipital protuberance. The second injection site was 1.5 cm superiorly and laterally to the first injection  site.  -Trapezius muscle injection was performed at 3 sites, bilaterally. The first injection site was in the upper trapezius muscle halfway between the inflection point of the neck, and the acromion. The second injection site was one half way between the acromion and the first injection site. The third injection was done between the first injection site and the inflection point of the neck.   -Masseter muscle: 5 units bilaterally     Will return for repeat injection in 3 months.   A total of 200 units of Botox  was prepared, 165 units of Botox  was injected as documented above, any Botox  not injected was wasted. The patient tolerated the procedure well, there were no complications of the above procedure.

## 2024-07-20 ENCOUNTER — Ambulatory Visit: Admitting: Neurology

## 2024-07-20 ENCOUNTER — Encounter: Payer: Self-pay | Admitting: Family Medicine

## 2024-07-20 ENCOUNTER — Ambulatory Visit: Admitting: Family Medicine

## 2024-07-20 DIAGNOSIS — G43709 Chronic migraine without aura, not intractable, without status migrainosus: Secondary | ICD-10-CM | POA: Diagnosis not present

## 2024-07-20 DIAGNOSIS — G43009 Migraine without aura, not intractable, without status migrainosus: Secondary | ICD-10-CM

## 2024-07-20 MED ORDER — ONABOTULINUMTOXINA 200 UNITS IJ SOLR
155.0000 [IU] | Freq: Once | INTRAMUSCULAR | Status: AC
Start: 2024-07-20 — End: 2024-07-20
  Administered 2024-07-20: 165 [IU] via INTRAMUSCULAR

## 2024-07-20 MED ORDER — ONDANSETRON 4 MG PO TBDP
4.0000 mg | ORAL_TABLET | Freq: Three times a day (TID) | ORAL | 0 refills | Status: AC | PRN
Start: 2024-07-20 — End: ?

## 2024-07-20 NOTE — Progress Notes (Signed)
 Botox - 200 units x 1 vial Lot: [id number] Expiration: 07/2026 NDC: 9976-6078-97  Bacteriostatic 0.9% Sodium Chloride - 4 mL  Lot: [id number] Expiration: 07/29/2025 NDC: 9590-8033-97  Dx: G43.009 S/P Witnessed by Sherrod Climes, RMA

## 2024-07-20 NOTE — Addendum Note (Signed)
 Addended by: CARY NO L on: 07/20/2024 08:33 AM   Modules accepted: Orders

## 2024-09-16 ENCOUNTER — Other Ambulatory Visit: Payer: Self-pay

## 2024-09-26 ENCOUNTER — Other Ambulatory Visit: Payer: Self-pay

## 2024-10-13 ENCOUNTER — Other Ambulatory Visit: Payer: Self-pay

## 2024-10-13 ENCOUNTER — Other Ambulatory Visit: Payer: Self-pay | Admitting: Pharmacy Technician

## 2024-10-13 NOTE — Progress Notes (Signed)
 Specialty Pharmacy Refill Coordination Note  Kimberly Simpson is a 49 y.o. female assessed today regarding refills of clinic administered specialty medication(s) OnabotulinumtoxinA  (BOTOX )   Clinic requested Courier to Provider Office   Delivery date: 10/17/24   Verified address: [facility] [address]   Medication will be filled on: 10/14/24

## 2024-10-14 ENCOUNTER — Other Ambulatory Visit: Payer: Self-pay

## 2024-10-17 ENCOUNTER — Ambulatory Visit: Admitting: Family Medicine

## 2024-10-24 ENCOUNTER — Ambulatory Visit: Admitting: Family Medicine

## 2024-10-24 DIAGNOSIS — G43009 Migraine without aura, not intractable, without status migrainosus: Secondary | ICD-10-CM

## 2024-10-25 ENCOUNTER — Ambulatory Visit: Admitting: Family Medicine

## 2024-10-25 ENCOUNTER — Encounter: Payer: Self-pay | Admitting: Family Medicine

## 2024-10-25 VITALS — BP 123/80

## 2024-10-25 DIAGNOSIS — G43709 Chronic migraine without aura, not intractable, without status migrainosus: Secondary | ICD-10-CM | POA: Diagnosis not present

## 2024-10-25 DIAGNOSIS — G43009 Migraine without aura, not intractable, without status migrainosus: Secondary | ICD-10-CM

## 2024-10-25 MED ORDER — ONABOTULINUMTOXINA 200 UNITS IJ SOLR
155.0000 [IU] | Freq: Once | INTRAMUSCULAR | Status: AC
  Administered 2024-10-25: 165 [IU] via INTRAMUSCULAR

## 2024-10-25 NOTE — Progress Notes (Signed)
 Botox - 200 units x 1 vial Lot: [id number] Expiration: 2028/03 NDC: 0023-3921-02  Bacteriostatic 0.9% Sodium Chloride - 4 mL  Lot: [id number] Expiration: 07/29/25 NDC: 9590803397  Dx:  H56.990, G43.709 S/P Witnessed by JINNY ROYS

## 2024-10-25 NOTE — Progress Notes (Signed)
 "  10/25/2024 ALL: Kimberly Simpson returns for Botox . She was cancelled yesterday for weather. She reports waxing and waning headaches. 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 She has been on progesterone and recently started estrogen. She is having 2-3 headache days a week. Nurtec and eletriptan  help.   07/20/2024 ALL: Kimberly Simpson returns for Botox . She was last seen by me 04/2021 and has followed with Dr Ines since. At baseline was having >15 headache days a month and > 8-10 moderate to severe migraine days a month. Now 4 migraine days a month and < 10 total headache days a month failed imitrex, maxalt, relpax . She continues topiramate , ondansetron , Nurtec and eletriptan . She usually takes Nurtec about 6-8 times a month, eletriptan  and ondansetron  2-3 times per month.   05/16/2021 ALL: She continues Botox , topiramate  and Relpax . She is doing well. Not having headaches. She had Covid last week but has recovered well. She did take Paxlovid.   02/07/2021 ALL: She continue to do well on Botox  and topiramate . Relpax  works well for abortive therapy. She rarely has migraines until the week prior to Botox . Unfortunately, she is behind a few weeks due to last appt being canceled due to me being out sick.   Dr Ines prescribed Nurtec 02/2019. Patient does not remember taking this for abortive therapy. Has tried and failed sumatriptan .   10/10/2020 ALL: She is doing well. Headaches have been better since last visit. She has not had to use abortive therapy recently.   Consent Form Botulism Toxin Injection For Chronic Migraine    Reviewed orally with patient, additionally signature is on file:  Botulism toxin has been approved by the Federal drug administration for treatment of chronic migraine. Botulism toxin does not cure chronic migraine and it may not be effective in some patients.  The administration of botulism toxin is accomplished by injecting a small amount of toxin into the muscles of the neck and head. Dosage  must be titrated for each individual. Any benefits resulting from botulism toxin tend to wear off after 3 months with a repeat injection required if benefit is to be maintained. Injections are usually done every 3-4 months with maximum effect peak achieved by about 2 or 3 weeks. Botulism toxin is expensive and you should be sure of what costs you will incur resulting from the injection.  The side effects of botulism toxin use for chronic migraine may include:   -Transient, and usually mild, facial weakness with facial injections  -Transient, and usually mild, head or neck weakness with head/neck injections  -Reduction or loss of forehead facial animation due to forehead muscle weakness  -Eyelid drooping  -Dry eye  -Pain at the site of injection or bruising at the site of injection  -Double vision  -Potential unknown long term risks   Contraindications: You should not have Botox  if you are pregnant, nursing, allergic to albumin, have an infection, skin condition, or muscle weakness at the site of the injection, or have myasthenia gravis, Lambert-Eaton syndrome, or ALS.  It is also possible that as with any injection, there may be an allergic reaction or no effect from the medication. Reduced effectiveness after repeated injections is sometimes seen and rarely infection at the injection site may occur. All care will be taken to prevent these side effects. If therapy is given over a long time, atrophy and wasting in the muscle injected may occur. Occasionally the patient's become refractory to treatment because they develop antibodies to the toxin. In this event, therapy needs  to be modified.  I have read the above information and consent to the administration of botulism toxin.    BOTOX  PROCEDURE NOTE FOR MIGRAINE HEADACHE  Contraindications and precautions discussed with patient(above). Aseptic procedure was observed and patient tolerated procedure. Procedure performed by Greig Forbes, FNP-C.    The condition has existed for more than 6 months, and pt does not have a diagnosis of ALS, Myasthenia Gravis or Lambert-Eaton Syndrome.  Risks and benefits of injections discussed and pt agrees to proceed with the procedure.  Written consent obtained  These injections are medically necessary. Pt  receives good benefits from these injections. These injections do not cause sedations or hallucinations which the oral therapies may cause.   Description of procedure:  The patient was placed in a sitting position. The standard protocol was used for Botox  as follows, with 5 units of Botox  injected at each site:  -Procerus muscle, midline injection  -Corrugator muscle, bilateral injection  -Frontalis muscle, bilateral injection, with 2 sites each side, medial injection was performed in the upper one third of the frontalis muscle, in the region vertical from the medial inferior edge of the superior orbital rim. The lateral injection was again in the upper one third of the forehead vertically above the lateral limbus of the cornea, 1.5 cm lateral to the medial injection site.  -Temporalis muscle injection, 4 sites, bilaterally. The first injection was 3 cm above the tragus of the ear, second injection site was 1.5 cm to 3 cm up from the first injection site in line with the tragus of the ear. The third injection site was 1.5-3 cm forward between the first 2 injection sites. The fourth injection site was 1.5 cm posterior to the second injection site. 5th site laterally in the temporalis  muscleat the level of the outer canthus.  -Occipitalis muscle injection, 3 sites, bilaterally. The first injection was done one half way between the occipital protuberance and the tip of the mastoid process behind the ear. The second injection site was done lateral and superior to the first, 1 fingerbreadth from the first injection. The third injection site was 1 fingerbreadth superiorly and medially from the first  injection site.  -Cervical paraspinal muscle injection, 2 sites, bilaterally. The first injection site was 1 cm from the midline of the cervical spine, 3 cm inferior to the lower border of the occipital protuberance. The second injection site was 1.5 cm superiorly and laterally to the first injection site.  -Trapezius muscle injection was performed at 3 sites, bilaterally. The first injection site was in the upper trapezius muscle halfway between the inflection point of the neck, and the acromion. The second injection site was one half way between the acromion and the first injection site. The third injection was done between the first injection site and the inflection point of the neck.   -Masseter muscle: 5 units bilaterally     Will return for repeat injection in 3 months.   A total of 200 units of Botox  was prepared, 165 units of Botox  was injected as documented above, any Botox  not injected was wasted. The patient tolerated the procedure well, there were no complications of the above procedure.   "

## 2024-11-08 ENCOUNTER — Ambulatory Visit: Admitting: Family Medicine

## 2025-01-17 ENCOUNTER — Ambulatory Visit: Admitting: Family Medicine
